# Patient Record
Sex: Female | Born: 1965 | Race: Black or African American | Hispanic: No
Health system: Western US, Academic
[De-identification: ages and names within clinical notes are randomized; demographics above are authoritative.]

## PROBLEM LIST (undated history)

## (undated) DIAGNOSIS — F419 Anxiety disorder, unspecified: Secondary | ICD-10-CM

## (undated) MED FILL — GLATIRAMER 40 MG/ML SUBCUTANEOUS SYRINGE: 40 mg/mL | SUBCUTANEOUS | 28 days supply | Qty: 12 | Fill #5

## (undated) MED FILL — ALPRAZOLAM 0.25 MG TABLET: 0.25 mg | ORAL | 30 days supply | Qty: 30 | Fill #0

## (undated) MED FILL — GLATIRAMER 40 MG/ML SUBCUTANEOUS SYRINGE: 40 mg/mL | SUBCUTANEOUS | Qty: 12 | Fill #0

## (undated) MED FILL — GLATIRAMER 40 MG/ML SUBCUTANEOUS SYRINGE: 40 mg/mL | SUBCUTANEOUS | 28 days supply | Qty: 12 | Fill #0

---

## 2017-01-17 NOTE — Telephone Encounter (Signed)
Patient called and LVM c/o pain. Patient is requesting an Rx to be sent to the pharmacy.    Returned patient's phone call and LVM requesting a call back.     Sent message to Velna Hatchet requesting to reach out to patient to schedule an appointment.

## 2017-03-23 NOTE — Telephone Encounter (Signed)
Patient called and left a message stating she has been experiencing an exacerbation since 01/25/16. Stated in the message that she "needs to talk to someone" as she is very sick still. She is requesting to speak with Kirkland Hun, NP. Noted patient has an appointment with Dr. Joeseph Amor on 03/27/17.    Returned patient's phone call to discuss symptoms. Patient answered and states she found some paperwork that reports she has problems with swallowing. Patient then states "I would like to schedule an appointment with patient relations". When asked to clarify who she would like to schedule an appointment with, patient then stated that she will need to call me back as she is in a meeting. She then hung up the phone.

## 2017-12-25 MED ORDER — GLATIRAMER 40 MG/ML SUBCUTANEOUS SYRINGE
40 | INJECTION | SUBCUTANEOUS | 0 refills | Status: DC
Start: 2017-12-25 — End: 2018-05-10

## 2017-12-25 NOTE — Telephone Encounter (Signed)
LPV to schedule follow-up with Dr. Joeseph Amor.

## 2017-12-25 NOTE — Telephone Encounter (Signed)
Received a refill request for pt's Copaxone 40 mg. Noted pt's last Copaxone was prescribed on 03/09/17 and pt was last seen by Dr. Joeseph Amor on 01/25/16. Called pt to f/u. Unable to get hold. LVM requesting to contact back.     No f/u appt scheduled noted. Will request follow up coordinator to assist with appointment and will pend refill request for Kirkland Hun, NP (covering for Dr. Joeseph Amor).

## 2017-12-25 NOTE — Telephone Encounter (Signed)
LVM for patient on both home and cell numbers requesting call to be return so scheduling of this patient ca be completed.

## 2017-12-28 NOTE — Telephone Encounter (Signed)
LVM to have pt call me back to schedule a fup appt with Dr. Joeseph Amor.

## 2017-12-28 NOTE — Telephone Encounter (Signed)
Patient called and LVM requesting for an appointment. Noted pt was last seen by MD on 01/25/2016. Will notify f/u coordinator to assist.

## 2018-02-27 ENCOUNTER — Ambulatory Visit: Admit: 2018-02-27 | Discharge: 2018-02-27 | Payer: MEDICARE | Attending: Neurology

## 2018-02-27 DIAGNOSIS — G35 Multiple sclerosis: Secondary | ICD-10-CM

## 2018-02-27 MED ORDER — PANTOPRAZOLE 40 MG TABLET,DELAYED RELEASE
40 | ORAL_TABLET | Freq: Every day | ORAL | 0 refills | Status: AC
Start: 2018-02-27 — End: ?

## 2018-02-27 MED ORDER — DIAPER,BRIEF,INFANT-TODD,DISP
Status: AC
Start: 2018-02-27 — End: ?

## 2018-02-27 MED ORDER — PREDNISONE 10 MG TABLET
10 | ORAL_TABLET | ORAL | 0 refills | Status: AC
Start: 2018-02-27 — End: ?

## 2018-02-27 NOTE — Progress Notes (Signed)
F/U MS     Meds:  Copaxone (generic) 40 mg tiw;        (feels that she is getting more SE (SOB, palpitations &, pain at IS with each injection)       and having more difficulty with MS)                          Advir 1 puff bid (asthma - COPD)                          Oxycontin 20 mg bid   (pain) - Pain management                          Norco (7.5/325) - 3-4 per day (pain) - Pain management                          CBD cream (helps with cramping)                          Vit D 4000 IU/d    10 Pt ROS () ;  Bladder:  +urgency; incont 2/wk    ;      --  got prescription for depends -- this is great as she doesn't have to pay out of pocket)      Bowel:   +const (better with Ensure)    Brain MRI: Not Done (last 2014)     Labs: Done yesterday -- pt to send     c/o: For the past 2-3 months her R hand has been shaking and has pain in forearm with writing     Numbness in fingertips as well    Not much change over interval     Last time she got steroids (prednisone orally) ;  but she didn't tolerate (had to be hospitalized for 3 days)    Tolerates a low dose     Otherwise no episodes     Uses walker or cane all the time; walks with a cane ~1 block (per pt) ; much less (per grand child)        Exam:    MS:  A+O x3  ;  Cog Fx:    nl   CrN II-XII: VA: 20/25 (-1)  OD ;  20/25 (-1)  OS    (with great effort)       () INO ;   () MG ; () Nyst    H / FS / FM / T / Palate / SCM : nl   Hearing (=) today    Motor:    Tone:     UE  nl ; LE:  nl     D T B WE FA IP Q ADD ABD H TA   R * *  * * *  * *  *   L 5+ 5+  5+ 5+ 5+  5+ 5+  5+     * [place] bilat but with occ full power on L; Never full power  on R but power varible.   Unable to lift R foot off floor but able to hold weight on R TA    Sens:  LT: Decreased in R UE (70%) and R LE (50%)    Cbl:  FTN, HKS,  RAM: Without ataxia  But with bilat tremor 6-8 Hz (sustention>intention>>rest).     In general tremor (R>>L) but varies; seems to affect legs as well while standing     L  hand and legs totally quiet at rest;     tone completely normal ; no other Sx of PD    Gait:  Very tentative and unsteady; requires assistance even for a few steps      DTRs  UE: 2+ (=)  LE: 3+ (=)       Assess: Possible MS Flare    Plan:  MRI Brain and C-spine    Rx with 80 mg prednisone and then taper;  protonix    Will switch back to brand name Copaxone    RV 6 months      I spent a total of  40 minutes with this patient, 30 of which was spent in  discussing with the patient the various treatment options, the disease course, and the patient's prognosis.

## 2018-05-14 MED ORDER — COPAXONE 40 MG/ML SUBCUTANEOUS SYRINGE
40 | SUBCUTANEOUS | 3 refills | Status: DC
Start: 2018-05-14 — End: 2019-04-01

## 2018-08-06 NOTE — Progress Notes (Signed)
[facility]  Pharmacist Medication Reconciliation Review       Spoke with Patient who agreed to a comprehensive medication review.    Updated Allergies     Patient allergies updated:  Yes    Medication Discrepancies and Points of Clarification    Medications ON the [facility] list that the patient is NOT taking:  baclofen    Medications NOT on the [facility] list that the patient IS taking:  None noted    Medications ON the [facility] list that the patient is taking DIFFERENTLY than ordered:  None noted    Drug-Drug interactions that were identified:  None noted     A/P  1. Patient needs md appt prior to getting refill for baclofen       Estimated total time spent: 0 to 5 minutes    [facility]  [phone]

## 2018-08-21 NOTE — Telephone Encounter (Signed)
Return patient's call requesting to schedule follow up appointment. Left message to callback.

## 2018-10-22 MED FILL — GLATIRAMER 40 MG/ML SUBCUTANEOUS SYRINGE: 40 mg/mL | SUBCUTANEOUS | 84 days supply | Qty: 36 | Fill #0

## 2018-11-30 NOTE — Telephone Encounter (Signed)
Called patient and assisted with follow up appointment

## 2018-12-10 NOTE — Telephone Encounter (Signed)
Called and left message to reschedule appointment

## 2019-01-14 MED FILL — GLATIRAMER 40 MG/ML SUBCUTANEOUS SYRINGE: 40 mg/mL | SUBCUTANEOUS | 84 days supply | Qty: 36 | Fill #1

## 2019-04-02 MED ORDER — COPAXONE 40 MG/ML SUBCUTANEOUS SYRINGE
40 | SUBCUTANEOUS | 3 refills | Status: DC
Start: 2019-04-02 — End: 2020-03-11
  Filled 2019-04-08: qty 36, 84d supply, fill #0

## 2019-04-02 NOTE — Telephone Encounter (Signed)
Called and assisted patient with follow up appointment.     Also, patient is requesting Zanax.    Routed to [place], to further assist.

## 2019-04-11 NOTE — Telephone Encounter (Signed)
Patient Navigator Support  Date: 8.27.29    Notes:  Tashis's VM:    - Cam LVM  - They called our clinic and LVM on 8/17 but has not heard back.   - Can patient use the generic medication of COPAXONE 40 mg/mL injection  - Please give them a call back.    - Phone #: [phone]

## 2019-04-29 NOTE — Telephone Encounter (Signed)
Per Dr.Goodin's recommendation, pended xanax 0.25mg  prn.

## 2019-04-30 MED ORDER — NALOXONE 4 MG/ACTUATION NASAL SPRAY
4 | Freq: Once | NASAL | 0 refills | Status: AC | PRN
Start: 2019-04-30 — End: ?

## 2019-04-30 MED ORDER — ALPRAZOLAM 0.25 MG TABLET
0.25 | ORAL_TABLET | Freq: Every day | ORAL | 1 refills | 30.00000 days | Status: AC | PRN
Start: 2019-04-30 — End: 2019-05-28

## 2019-05-28 MED ORDER — ALPRAZOLAM 0.25 MG TABLET
0.25 | ORAL | 0 refills | Status: AC | PRN
Start: 2019-05-28 — End: ?

## 2019-06-27 MED FILL — GLATIRAMER 40 MG/ML SUBCUTANEOUS SYRINGE: 40 mg/mL | SUBCUTANEOUS | 84 days supply | Qty: 36 | Fill #1 | Status: CP

## 2019-07-22 ENCOUNTER — Ambulatory Visit: Admit: 2019-07-22 | Discharge: 2019-07-27 | Payer: MEDICARE | Attending: Neurology | Primary: Physician

## 2019-07-22 DIAGNOSIS — G35 Multiple sclerosis (CMS code): Secondary

## 2019-07-22 NOTE — Telephone Encounter (Signed)
Physician/Bacline call: received a call from patient requesting assistance re: her recent visit to the emergency room. Per patient, she fell and hit her head and was in ER. Patient was crying and requesting assistance and advise from Dr. Blenda Peals. Informed patient that will reach out to the nurse and provided a contact information for future reference.     Routed to [place], Therapist, sports, to further assist.

## 2019-07-22 NOTE — Telephone Encounter (Signed)
MD aware and recommended to seek care at [facility] ER.

## 2019-07-22 NOTE — Progress Notes (Signed)
Telephone Call      Reason for the call: On Thursday patient fell down 7 flights of stairs, hit her head and had LOC for several minutes. Now, apparently, with multiple bruises, a black eye and marked difficulty with memory. See in local ER but sent home without scan.      Recommendation:  Go to the [facility] ER today to be evaluated

## 2019-07-23 ENCOUNTER — Emergency Department: Admit: 2019-07-23 | Discharge: 2019-07-23 | Payer: MEDICARE

## 2019-07-23 DIAGNOSIS — M25571 Pain in right ankle and joints of right foot: Secondary | ICD-10-CM

## 2019-07-23 DIAGNOSIS — S0990XA Unspecified injury of head, initial encounter: Secondary | ICD-10-CM

## 2019-07-23 DIAGNOSIS — G35 Multiple sclerosis: Secondary | ICD-10-CM

## 2019-07-23 DIAGNOSIS — M5030 Other cervical disc degeneration, unspecified cervical region: Secondary | ICD-10-CM

## 2019-07-23 DIAGNOSIS — M47812 Spondylosis without myelopathy or radiculopathy, cervical region: Secondary | ICD-10-CM

## 2019-07-23 DIAGNOSIS — R519 Headache, unspecified: Secondary | ICD-10-CM

## 2019-07-23 LAB — POCT COVID-19 RNA, QUALITATIVE: POCT COVID-19 RNA, Qualitative: NOT DETECTED

## 2019-07-23 LAB — ED INFORMATION EXCHANGE ORDER: EDIE Cures: 1

## 2019-07-23 MED ORDER — KETOROLAC 15 MG/ML INJECTION SOLUTION: 15 mg/mL | INTRAMUSCULAR | Status: DC

## 2019-07-23 MED ORDER — KETOROLAC 15 MG/ML INJECTION SOLUTION
15 mg/mL | INTRAMUSCULAR | Status: AC
  Administered 2019-07-23: 23:00:00 via INTRAMUSCULAR

## 2019-07-23 MED ORDER — ACETAMINOPHEN 500 MG TABLET
500 mg | ORAL | Status: AC
  Administered 2019-07-23: 23:00:00 via ORAL

## 2019-07-23 MED ORDER — ONDANSETRON 4 MG DISINTEGRATING TABLET
4 mg | ORAL | Status: DC | PRN
  Administered 2019-07-23: 23:00:00 via ORAL

## 2019-07-23 MED FILL — ACETAMINOPHEN 500 MG TABLET: 500 mg | ORAL | Qty: 2

## 2019-07-23 MED FILL — ONDANSETRON 4 MG DISINTEGRATING TABLET: 4 mg | ORAL | Qty: 1

## 2019-07-23 MED FILL — KETOROLAC 15 MG/ML INJECTION SOLUTION: 15 mg/mL | INTRAMUSCULAR | Qty: 1

## 2019-07-23 NOTE — ED Provider Notes (Signed)
ED Attendings     Zenda Alpers 07/23/19 14:26    History     Chief Complaint   Patient presents with    Fall     Pt with MS. Fell down seven steps on 07/18/19. Was seen at another ED then and DC'd without fx discovered or CT performed. Pt reports pain at various locations and her [place] [facility] Specialist told pt to report to ED for CT due to LOC at time of event and left sided head pain. Pt is A&Ox4.    Head Injury     reports nausea and one episode of vomiting today.       HPI  Jamerria Deturk 53 yo F hx of asthma, multiple sclerosis, GERD, chronic pain? Presents with fall with head trauma. Patient reportedly fell last Thursday as she was ambulating downstairs with her cane. Reports mechanical fall 2/2 improper cane placement. +LOC. Her neighbor saw the patient on the floor and subsequently called EMS. Patient seen at OSH on 12/3 with no imaging.     Today patient presents for R frontal and R posterior head pain. Reports episode of nausea/vomiting this morning, although has felt nauseous for the past few days. Reports difficulty looking at lights. Denies new sensory or motor deficits. +R ankle pain. Reports she has been forgetful and has been misplacing things for the past few days. Otherwise at baseline health. Patient well plugged into care. Patient called her Multiple Sclerosis clinic who told her to come in to the  ED for evaluation.     Allergies/Contraindications  No Known Allergies    Previous Medications    ALBUTEROL INH    Inhale into the lungs 3 (three) times daily.    ALPRAZOLAM (XANAX) 0.25 MG TABLET    Take 1 tablet (0.25 mg total) by mouth daily as needed for Anxiety    BACLOFEN (LIORESAL) 10 MG TABLET    Take 2 tablets (20 mg total) by mouth nightly at bedtime.    COPAXONE 40 MG/ML INJECTION    Inject 1 syringe (40mg ) subcutaneously three times a week.    DIAPER,BRIEF,INFANT-TODD,DISP (DIAPERS) MISC    by Misc.(Non-Drug; Combo Route) route.    DIAZEPAM (VALIUM) 5 MG TABLET    Take 1/2 tablet by mouth as  needed for anxiety and 1 tablet at bedtime as needed for insomnia while on steroid treatment.    FLUTICASONE/SALMETEROL (ADVAIR HFA INH)    Inhale into the lungs 2 (two) times daily.    HYDROCODONE-ACETAMINOPHEN (NORCO) 5-325 MG TABLET    Take 1 tablet by mouth every 6 (six) hours as needed for Pain.    IPRATROPIUM-ALBUTEROL (COMBIVENT RESPIMAT) 20-100 MCG/ACTUATION INHALER    Inhale 1 puff into the lungs 4 (four) times daily.    NALOXONE 4 MG/ACTUATION SPRAYNAERO    1 spray by Nasal route once as needed (suspected overdose) for up to 1 dose Call 911. Repeat if needed    OXYCODONE (OXYCONTIN) 20 MG 12 HR TABLET    Take 20 mg by mouth 2 (two) times daily.    PANTOPRAZOLE (PROTONIX) 40 MG EC TABLET    Take 1 tablet (40 mg total) by mouth Daily.    PREDNISONE (DELTASONE) 10 MG TABLET    Take 4 tabs (80 mg) po q day for 7 days ; then taper by 10 mg/d until off              Social History     Socioeconomic History    Marital status: Significant  Other     Spouse name: Not on file    Number of children: Not on file    Years of education: Not on file    Highest education level: Not on file   Tobacco Use    Smoking status: Smoker, Current Status Unknown     Types: Cigarettes    Tobacco comment: 4 times a day   Substance and Sexual Activity    Alcohol use: No    Drug use: No       Review of Systems     Review of Systems   Constitutional: Negative for chills and fever.   HENT: Negative for congestion and voice change.    Eyes: Negative for visual disturbance.   Respiratory: Negative for cough and shortness of breath.    Cardiovascular: Negative for chest pain and palpitations.   Gastrointestinal: Positive for nausea and vomiting. Negative for abdominal pain.   Genitourinary: Negative for difficulty urinating.   Musculoskeletal:        +fall. +R chest wall pain, R ankle pain. R head pain   Skin: Negative for pallor.   Neurological: Positive for headaches. Negative for syncope.   Psychiatric/Behavioral: Positive for  confusion. Negative for suicidal ideas.       Physical Exam   Triage Vital Signs:  BP: 136/70, Heart Rate: 84, Pulse - Palpated/Pleth: 84, Temp: 36.1 C (97 F), *Resp: 16, SpO2: 97 %    Physical Exam  Constitutional:       General: She is not in acute distress.     Appearance: Normal appearance. She is well-developed.      Comments: +tearful   HENT:      Head: Normocephalic and atraumatic.      Comments: +R frontal bone ttp, no hematoma or bruising. +R Posterior occiput ttp, no hematoma or bruising     Nose: No congestion.      Mouth/Throat:      Mouth: Mucous membranes are moist.   Eyes:      Extraocular Movements: Extraocular movements intact.      Conjunctiva/sclera: Conjunctivae normal.      Pupils: Pupils are equal, round, and reactive to light.   Neck:      Musculoskeletal: Normal range of motion.      Trachea: No tracheal deviation.   Cardiovascular:      Rate and Rhythm: Normal rate and regular rhythm.      Pulses: Normal pulses.      Comments: +R posterior chest wall ttp, no signs of erythema, crepitus or contusion.  Pulmonary:      Effort: No respiratory distress.      Breath sounds: Normal breath sounds.   Abdominal:      General: There is no distension.      Tenderness: There is no abdominal tenderness.   Musculoskeletal:         General: Tenderness present. No swelling or deformity.      Right lower leg: No edema.      Left lower leg: No edema.      Comments: +TTP over R ankle, anterior to R malelous   Skin:     General: Skin is warm.      Capillary Refill: Capillary refill takes less than 2 seconds.   Neurological:      Mental Status: She is alert and oriented to person, place, and time.      Cranial Nerves: No cranial nerve deficit.      Sensory: No sensory deficit.  Motor: No weakness.      Comments: Ambulatory with cane   Psychiatric:         Attention and Perception: Attention normal.         Mood and Affect: Affect is labile and tearful.         Speech: Speech is not tangential.          Thought Content: Thought content does not include homicidal or suicidal ideation.          Initial Assessment (problem list and differential diagnosis)     Rashon Rezek is  53 yo F hx of asthma, multiple sclerosis, GERD, chronic pain? Presents with fall with head trauma x 7 days ago. VS unremarkable. Exam, primarily MSK and neuro exam as stated above. Presentation most c/f concussion s/p fall with head trauma. Will rule out intracranial pathology including ICH v fracture with CT imaging. Patient endorsing R chest wall ttp, no objective signs of trauma or fracture will assess ribs with imaging study considering frx v contusion v pulm contusion, low concern for vascular catastrophe. Will assess R ankle for fracture but low concern given patient has been ambulatory with cane at baseline, low c/f neurovascular injury v compartment syndrome.    Plan:  - CT head/neck  - CXR, R Ankle Xray  - pain control    Interpretations:  Lab, Imaging, and ECG        XR Chest 1 View (AP Portable)   Final Result   FINDINGS/IMPRESSION:      Normal cardiac and mediastinal silhouettes.  Lungs appear clear.      Report dictated by: Aretta Nip, MD, signed by: Aretta Nip, MD   Department of Radiology and Biomedical Imaging      CT Brain without Contrast   Preliminary Result      1.  No acute intracranial abnormality.  Scattered supratentorial white matter hypodensities consistent with patient's history of MS      2.  No acute fracture or traumatic malalignment of the cervical spine.      3.  Multilevel moderate bony degenerative changes in the cervical spine with findings as described above            CT Cervical Spine without Contrast   Preliminary Result      1.  No acute intracranial abnormality.  Scattered supratentorial white matter hypodensities consistent with patient's history of MS      2.  No acute fracture or traumatic malalignment of the cervical spine.      3.  Multilevel moderate bony degenerative changes  in the cervical spine with findings as described above            XR Ankle 3 Views, Right    (Results Pending)         Reassessment and Final Disposition     ED Course as of Jul 22 1506   Jeziah Kretschmer's Documentation   Tue Jul 23, 2019   1410 CT head and C spine reassuring per conversation with neuro-radiologist. Pending CXR and Ankle Xray.      1420 CT Brain/C spine:  "IMPRESSION:     1.  No acute intracranial abnormality.  Scattered supratentorial white matter hypodensities consistent with patient's history of MS    2.  No acute fracture or traumatic malalignment of the cervical spine.    3.  Multilevel moderate bony degenerative changes in the cervical spine with findings as described above"      1445 CXR:  "  FINDINGS/IMPRESSION:    Normal cardiac and mediastinal silhouettes.  Lungs appear clear."      1450 Ankle w/o acute fracture.      1501 Patient reports she feels better. Will discharge home with strict return precautions.            Clarene Reamer, MD  Resident  07/23/19 1507       Tharon Aquas, MD  07/23/19 [phone]

## 2019-08-01 NOTE — Telephone Encounter (Signed)
At 12:29 PM, Pt called and LVM requesting for a call back. Called pt to follow up.     Pt reported she is f/u with her PCP d/t recent fall. Per pt, she has brain MRI done and no acute abnormal noted. Pt requested for her MRI #. Provided per pt's request.     Pt also requested for a f/u appointment. Will request Freda Munro [facility] to assist. Pt aware and requested to contact her at [phone].

## 2019-08-07 NOTE — Telephone Encounter (Signed)
On 08/06/2019 at 3:59 PM, pt called and LVM requesting for a call back. Reported experiencing symptoms. Noted pt's speech was slurred. Called pt to f/u.    Pt reported her speech is slurred and is having difficulty articulating words. Also c/o cloudy vision. Per pt,s she always had cloudy vision but noted worsening on her left eye currently. When questioned, Pt also c/o being forgetful. Noted pt recently had a fall and was seen at [facility] ER with head trauma.     When questioned, pt denied SOB, chest pain, fever, or any other acute symptoms. Pt reported she her Home Health Nurse came yesterday for a visit and recommended low stimulation surrounding to help with symptoms pt is experiencing.     Instructed pt to seek ER care since pt's speech was severely slurred. Pt reported ER would not treat for her current symptoms and reported she feels like it is a MS flare up. Requested to notify Dr. Blenda Peals and to provide steroids. Notified pt that I will follow up with Dr. Blenda Peals but would recommended to seek ER care if symptoms worsens. Pt verbalized awareness.    When questioned, pt does have help at home if needed.

## 2019-08-13 ENCOUNTER — Ambulatory Visit: Admit: 2019-08-13 | Payer: MEDICARE | Attending: Neurology | Primary: Physician

## 2019-08-13 DIAGNOSIS — G35 Multiple sclerosis (CMS code): Secondary

## 2019-08-13 MED ORDER — DIAZEPAM 5 MG TABLET
5 | ORAL_TABLET | ORAL | 0 refills | 4.00000 days | Status: AC
Start: 2019-08-13 — End: ?

## 2019-08-13 MED ORDER — PANTOPRAZOLE 40 MG TABLET,DELAYED RELEASE
40 | ORAL_TABLET | Freq: Every day | ORAL | 0 refills | Status: AC
Start: 2019-08-13 — End: ?

## 2019-08-13 MED ORDER — DEXAMETHASONE 4 MG TABLET
4 | ORAL_TABLET | ORAL | 0 refills | 6.00000 days | Status: DC
Start: 2019-08-13 — End: 2020-02-14

## 2019-08-13 MED ORDER — DEXAMETHASONE 4 MG TABLET
4 | ORAL_TABLET | Freq: Every day | ORAL | 0 refills | Status: AC
Start: 2019-08-13 — End: 2019-08-16

## 2019-08-13 NOTE — Progress Notes (Addendum)
F/U MS     Meds:    Copaxone (generic) 40 mg tiw;                                      (feels that she is getting more SE (SOB, palpitations &, pain at IS with each injection)                                                   and having more difficulty with MS)  Advir 1 puff bid (asthma - COPD)  Oxycontin 20 mg bid(pain) - Pain management  Norco (7.5/325) - 3-4 per day (pain) - Pain management  CBD cream (helps with cramping)  Vit D 4000 IU/d    10 Pt ROS () ;           Bladder:  +urgency; incont 2/wk  ;                                            --  got prescription for depends -- this is great as she doesn't have to pay out of pocket)                                        Bowel:   +const (better with Ensure)    CT Brain 07/23/19: CT head: No acute intracranial abnormality.  Scattered supratentorial white matter hypodensities consistent with patient's history of MS.     CT C-spine 07/23/19:   1. No acute intracranial abnormality.  Scattered white matter hypodensities consistent with multiple sclerosis.  2. No acute fracture or traumatic malalignment of the cervical spine. Multilevel moderate degenerative changes in the cervical spine.    Labs: Not done     c/o:   On Thursday 07/18/19 patient fell down 7 flights of stairs, hit her head and had LOC for several minutes. Now, apparently, with multiple bruises, a black eye and marked difficulty with memory. See in local ER but sent home without scan.    Seen in [place] ED 07/23/19  For: "Today patient presents for R frontal and R posterior head pain. Reports episode of nausea/vomiting this morning, although has felt nauseous for the past few days. Reports difficulty looking at lights. Denies new sensory or motor deficits + R ankle pain. Reports she has been forgetful and has been misplacing things for the past few days."       CT head: No acute  intracranial abnormality.  Scattered supratentorial white matter hypodensities consistent with patient's history of MS.    Since fall speech has become increasingly difficulty ; maybe a little better in past 1-2 days. She has had these Sx before. She feels that this is an MS sx. Previously she responded to IVSM.           Exam:   Speech is very halting and nonfluent. Not aphasic and no difficulty with comprehension.     Repeats easily and includes all connectors for "its a beautiful day in [place]"    Has considerable difficulty with "no ifs and or buts"  Assess: ? Episode of MS     Plan:   Pt very much wants to try course of oral steroids (as before)    [place] -- we will try a course of steroids + ambien + protonix    Will touch base next week       I spent a total of 25 minutes in face -to-face time with the patient and in non-face-to-face activities conducted today 08/13/19 directly related to this telephone visit, including reviewing records and tests, obtaining history and exam, placing orders, communicating with other healthcare professionals, counseling the patient, family or caregiver, documenting in the medical record, and/or care coordination for the diagnoses above.

## 2019-08-14 MED ORDER — ALPRAZOLAM 0.25 MG TABLET
0.25 | ORAL_TABLET | Freq: Every day | ORAL | 3 refills | Status: DC | PRN
Start: 2019-08-14 — End: 2020-08-28

## 2019-08-14 NOTE — Telephone Encounter (Signed)
Called and spoke with Wendy Pena, pharmacist and gave verbal order per Dr. Octaviano Batty order as below;    diazePAM (VALIUM) 5 mg tablet 30 tablet 0 08/13/2019     Sig: Take1 tablet at bedtime as needed for insomnia while on steroid treatment.        Pharmacist read back the order.

## 2019-09-17 MED FILL — GLATIRAMER 40 MG/ML SUBCUTANEOUS SYRINGE: 40 mg/mL | SUBCUTANEOUS | 28 days supply | Qty: 12 | Fill #2 | Status: CP

## 2019-09-19 ENCOUNTER — Ambulatory Visit: Admit: 2019-09-19 | Payer: Commercial Managed Care - HMO | Attending: Neurology | Primary: Physician

## 2019-09-19 DIAGNOSIS — G35 Multiple sclerosis (CMS code): Secondary

## 2019-09-19 DIAGNOSIS — G44311 Acute post-traumatic headache, intractable: Secondary | ICD-10-CM

## 2019-09-19 NOTE — Progress Notes (Signed)
F/U MS     Meds:  Copaxone(generic)40 mg tiw;  (feels that she is getting more SE (SOB, palpitations &, pain at IS with each injection)   and having more difficulty with MS)  Advir 1 puff bid (asthma - COPD)  Oxycontin 20 mg bid(pain) - Pain management  Norco (7.5/325) - 3-4 per day (pain) - Pain management  CBD cream (helps with cramping)  Vit D4000 IU/d    10 Pt ROS () ;  Bladder:   OK         Bowel:    OK    Brain MRI: Not Done (last July 2019)    Labs: Not done     c/o: Speech is still very halting and nonfluent.    Not aphasic and no difficulty with comprehension.      Now c/o HA holocephalic; throbbing; +photophobia; +phonophobia    +visual  Sx "boxes"     Maybe a little better or No ?                               Exam:  Has difficulty with:    "no ifs ands or buts"  =   "no ifs ands _ buts"     "its [place]"  =  "its a beautiful day [place]      Assess: Unclear why her speech is still being affected      Plan:   Repeat MRI  -- Compare with previous    RV after scan         I spent a total of 20 minutes in face -to-face time with the patient and in non-face-to-face activities conducted today 09/20/19 directly related to this video visit, including reviewing records and tests, obtaining history and exam, placing orders, communicating with other healthcare professionals, counseling the patient, family or caregiver, documenting in the medical record, and/or care coordination for the diagnoses above.

## 2019-10-01 ENCOUNTER — Ambulatory Visit: Admit: 2020-02-18 | Payer: Commercial Managed Care - HMO | Attending: Neurology | Primary: Physician

## 2019-10-16 MED FILL — GLATIRAMER 40 MG/ML SUBCUTANEOUS SYRINGE: 40 mg/mL | SUBCUTANEOUS | 28 days supply | Qty: 12 | Fill #3 | Status: CP

## 2019-11-06 NOTE — Telephone Encounter (Signed)
Rx: Copaxone 40mg /ml injection  PA: Submitted  CMM Key:  Insurance info: [employer] Medicare D    Notes:    [facility]  Fax: [phone]  Pharmacy:[phone]

## 2019-11-06 NOTE — Telephone Encounter (Signed)
PRIOR AUTHORIZATION STATUS UPDATE    Rx: Copaxone 40mg /ml    PA Status: Approved  PA Case #:  Start Date of PA: 08/16/19  End Date of PA: 11/05/20  # of Refills Authorized: until 11/05/20    Pharmacy: [facility]  Pharmacy Phone Number: [phone]  Please send refills to [facility] for insurance benefits investigation.    Insurance info: An[employer] Medicare  Member ID#: [id number]  Copay: $0 Copay    Note: [facility] will notify patient and coordinate medication delivery.      [id number]  [facility]  Fax: [phone]  Phone: [phone]

## 2019-11-11 MED FILL — GLATIRAMER 40 MG/ML SUBCUTANEOUS SYRINGE: 40 mg/mL | SUBCUTANEOUS | 28 days supply | Qty: 12 | Fill #4 | Status: CP

## 2019-12-26 MED FILL — GLATIRAMER 40 MG/ML SUBCUTANEOUS SYRINGE: 40 mg/mL | SUBCUTANEOUS | 28 days supply | Qty: 12 | Fill #5 | Status: CP

## 2020-01-22 MED FILL — GLATIRAMER 40 MG/ML SUBCUTANEOUS SYRINGE: 40 mg/mL | SUBCUTANEOUS | 28 days supply | Qty: 12 | Fill #6 | Status: CP

## 2020-02-12 NOTE — Telephone Encounter (Signed)
Faxed imaging request to [facility] at [phone].    --------------------------------------  Re: Wendy Pena  DOB: [birth year]    -IMAGING REQUEST-    Patient was referred to our clinic to establish care. Please send all available MR/CT imaging (i.e. brain, spine, orbits) in a DIACOM-compatible disc to the following address:    [facility]  ATTN: Dr. Jacqulynn Cadet  [address]  [place], [place] [address]    OR    Push images electronically through PACS to "[facility]" and please notify us either by call/fax when done so we can process.    Please fax reports to [phone], attention to Dr. Jacqulynn Cadet.    Thank you.

## 2020-02-14 MED ORDER — LIDOCAINE (PF) 10 MG/ML (1 %) NASAL SPRAY
10 | NASAL | Status: AC
Start: 2020-02-14 — End: ?

## 2020-02-14 MED ORDER — DICLOFENAC 1 % TOPICAL GEL
1 | TOPICAL | Status: AC
Start: 2020-02-14 — End: ?

## 2020-02-14 NOTE — Progress Notes (Signed)
[facility]  Pharmacist Medication Management      Spoke with Patient who agreed to a comprehensive medication review.    Patient allergies reviewed:  Yes  a. Any onset of new allergies? None noted    Medication recall process reviewed with patient and verbalized understanding?  Yes    Does the patient have an emergency contact person?  Yes: Dorette Grate [phone]; removed Deandre Chatman    Does the patient have any dietary restrictions? i.e. vegan, gluten-free, etc.  None noted     Does the patient have any functional limitations? i.e. hearing impaired, mobility constraints, etc.  Yes, patient new functional limitations include worsened memory and increased pain on left side. Patient has MRI and follow-up scheduled with provider.    Does the patient have any concerns in affording their medication?  Patient has $0 copay    Does patient have adequate living arrangements to adhere to therapy? i.e. homeless, no refrigerator, etc.  Yes. Patient's apartment has an upcoming remodel scheduled for 03/30/2020. Patient is requesting to stay on site during remodel.    Is patient educated on emergency preparedness? Information is in our [employer] and pharmacy website under resources  Yes    For re-assessments only: Has patient missed any doses in the last 3 months?  No missed doses    Patient's Goal of Therapy:  Patient agrees to goals of therapy: stay stable on medication with no side effects      [facility]  Pharmacist Medication Reconciliation    Medication Discrepancies and Points of Clarification    Medications ON the [facility] list that the patient is NOT taking:  Alprazolam 0.25mg ; Diazepam 5mg ; Dexamethasone 4mg ; Prednisone 10mg      Medications NOT on the [facility] list that the patient IS taking:  Lidocaine nasal spray; Diclofenac 1% gel    Medications ON the [facility] list that the patient is taking DIFFERENTLY than ordered:  None noted    Drug-Drug interactions that were identified:  None  noted      A/P  1. Continue Copaxone 40mg  three times a week. No reported side effects or missed doses. 6 month reassessment       Estimated total time spent: 10 to 15 minutes    , PharmD  [facility]  [phone]

## 2020-02-18 MED FILL — GLATIRAMER 40 MG/ML SUBCUTANEOUS SYRINGE: 40 mg/mL | SUBCUTANEOUS | 28 days supply | Qty: 12 | Fill #7 | Status: CP

## 2020-03-11 MED ORDER — COPAXONE 40 MG/ML SUBCUTANEOUS SYRINGE
40 | SUBCUTANEOUS | 3 refills | 29.00000 days | Status: DC
Start: 2020-03-11 — End: 2020-09-24
  Filled 2020-03-18: qty 12, 28d supply, fill #0

## 2020-04-02 ENCOUNTER — Other Ambulatory Visit: Payer: Self-pay | Admitting: Physician Assistant

## 2020-04-02 DIAGNOSIS — N644 Mastodynia: Secondary | ICD-10-CM

## 2020-04-09 MED FILL — GLATIRAMER 40 MG/ML SUBCUTANEOUS SYRINGE: 40 mg/mL | SUBCUTANEOUS | 28 days supply | Qty: 12 | Fill #1 | Status: CP

## 2020-05-07 MED FILL — GLATIRAMER 40 MG/ML SUBCUTANEOUS SYRINGE: 40 mg/mL | SUBCUTANEOUS | 28 days supply | Qty: 12 | Fill #2 | Status: CP

## 2020-05-08 ENCOUNTER — Ambulatory Visit
Admission: RE | Admit: 2020-05-08 | Discharge: 2020-05-08 | Disposition: A | Discharge status: Home / Self Care | Payer: No Typology Code available for payment source | Source: Ambulatory Visit | Attending: Physician Assistant | Admitting: Physician Assistant

## 2020-05-08 ENCOUNTER — Ambulatory Visit: Payer: Self-pay

## 2020-05-08 ENCOUNTER — Other Ambulatory Visit: Payer: Self-pay

## 2020-05-08 DIAGNOSIS — N644 Mastodynia: Secondary | ICD-10-CM

## 2020-06-04 MED FILL — GLATIRAMER 40 MG/ML SUBCUTANEOUS SYRINGE: 40 mg/mL | SUBCUTANEOUS | 28 days supply | Qty: 12 | Fill #3 | Status: CP

## 2020-06-29 MED FILL — GLATIRAMER 40 MG/ML SUBCUTANEOUS SYRINGE: 40 mg/mL | SUBCUTANEOUS | 28 days supply | Qty: 12 | Fill #4 | Status: CP

## 2020-07-24 MED ORDER — COPAXONE 40 MG/ML SUBCUTANEOUS SYRINGE: 40 mg/mL | SUBCUTANEOUS | 0 refills | Status: DC

## 2020-08-18 NOTE — Telephone Encounter (Signed)
Specialty Pharmacy  Prior Authorization Note    Medication prior authorization submitted for Copaxone 40MG /ML syringes    Submitted on 08/19/2019 via CMM Key: [id number]/17/[id number]  Insurance - [employer]    [id number]

## 2020-08-28 MED ORDER — COPAXONE 40 MG/ML SUBCUTANEOUS SYRINGE
40 | SUBCUTANEOUS | 3 refills | Status: AC
Start: 2020-08-28 — End: ?
  Filled 2020-09-04: qty 12, 28d supply, fill #5

## 2020-08-28 MED ORDER — ALPRAZOLAM 0.25 MG TABLET
0.25 | ORAL_TABLET | Freq: Every day | ORAL | 3 refills | 30.00000 days | Status: DC | PRN
Start: 2020-08-28 — End: 2021-05-04

## 2020-09-08 MED FILL — GLATIRAMER 40 MG/ML SUBCUTANEOUS SYRINGE: 40 mg/mL | SUBCUTANEOUS | 28 days supply | Qty: 12 | Fill #5 | Status: CN

## 2020-09-21 MED FILL — GLATIRAMER 40 MG/ML SUBCUTANEOUS SYRINGE: 40 mg/mL | SUBCUTANEOUS | 28 days supply | Qty: 12 | Fill #5 | Status: CN

## 2020-10-06 NOTE — Progress Notes (Signed)
ADHERENCE    Pharmacy was unable to contact patient after multiple attempts regarding refill of glatiramer (Copaxone) 40mg . Both phone numbers were inactive with no voicemail. Prescription was saved onto patient's medication profile for future fills.     Please have patient contact [facility] [phone] for refills.    Clinic notified.    Left voicemail 09/28/20  Left voicemail 09/30/20  No voicemail 10/02/20  No voicemail, No Mychart, clinic notified 10/06/20    10/08/20  [facility]  Phone: [phone]

## 2020-10-06 NOTE — Telephone Encounter (Signed)
Hi team,     Please assist.     Thank you,   Ale

## 2020-10-06 NOTE — Telephone Encounter (Signed)
Hi Tashi,     Called all the phone numbers in pt's chart, I was only able to leave a message requesting a call back from pt to the number ending in [phone].     Thank you,   Ale

## 2020-10-06 NOTE — Telephone Encounter (Signed)
Attempted to reach pt via [phone] since the other two phone number are out of service. Unable to get hold. MD has been notified.

## 2020-10-06 NOTE — Telephone Encounter (Signed)
Attempted to reach pt on all the three contact number listed on pt's file. Unable to get hold. Will request [facility] Team to assist to get hold of pt and will notify Goodin.

## 2020-10-08 ENCOUNTER — Ambulatory Visit: Admit: 2020-10-08 | Payer: Commercial Managed Care - HMO | Attending: Neurology | Primary: Physician

## 2020-10-08 DIAGNOSIS — G35 Multiple sclerosis (CMS code): Secondary

## 2020-10-08 NOTE — Progress Notes (Signed)
F/U MS     Meds:  Copaxone(generic)40 mg tiw;  (feels that she is getting more SE (SOB, palpitations &, pain at IS with each injection)   and having more difficulty with MS)  Advir 1 puff bid (asthma - COPD)  Oxycontin 20 mg bid(pain) - Pain management  Norco (7.5/325) - 3-4 per day (pain) - Pain management  CBD cream (helps with cramping)  Vit D4000 IU/d    10 Pt ROS (-) ;  Bladder:   OK         Bowel:    OK    Brain MRI: 10/01/19 -- +WM c/w MS -- (no GD given; no comparison)    Per my view, I agree but I can't load the 2014 scan to compare          Labs: Scheduled to have -- pt to send     c/o: Not doing well     Larey Seat down stairs in December 2021 -- +LOC x 5-10 min    -- migraines now every day ; worse after fall    -- speech worse    -- dropping things      Previously c/o daily HA holocephalic; throbbing; +photophobia; +phonophobia                          +visual  Sx "boxes"          No episodes        Exam:  Speech is still very halting and nonfluent.       Not aphasic and no difficulty with comprehension.       "no ifs ands or buts" =   "no ifs _ buts"     "its a beautiful day in [place]"  = "its a beautiful day [place]      Assess: Unclear exactly what is different    However, concerning that condition worse after significant fall with LOC      Plan:  Repeat MRI -- compare with previous    RV 6 months   Speak by phone after Scan              I spent a total of 30 minutes in face -to-face time with the patient and in non-face-to-face activities conducted today 10/08/20 directly related to this video visit, including reviewing records and tests, obtaining history and exam, placing orders, communicating with other healthcare professionals, counseling the patient, family or caregiver, documenting in the medical  record, and/or care coordination for the diagnoses above.

## 2020-10-08 NOTE — Telephone Encounter (Signed)
Called [phone] (pt's cell) and was able to get a ring tone but was not able to speak with pt. Left detail voice message and requested to contact [facility] at [phone] to obtain her Copaxone prescription.     Plus, requested to reach our clinic if further assistant is needed.

## 2020-11-05 IMAGING — MG DIGITAL DIAGNOSTIC BILAT W/ TOMO W/ CAD
8 series · 8 of 24 positions shown · non-contrast
Comparison: Previous exam(s).

CLINICAL DATA: 54-year-old female with diffuse UPPER breast pain.
Baseline mammogram.

EXAM:
DIGITAL DIAGNOSTIC BILATERAL MAMMOGRAM WITH CAD AND TOMO

[L CC synth-2D]
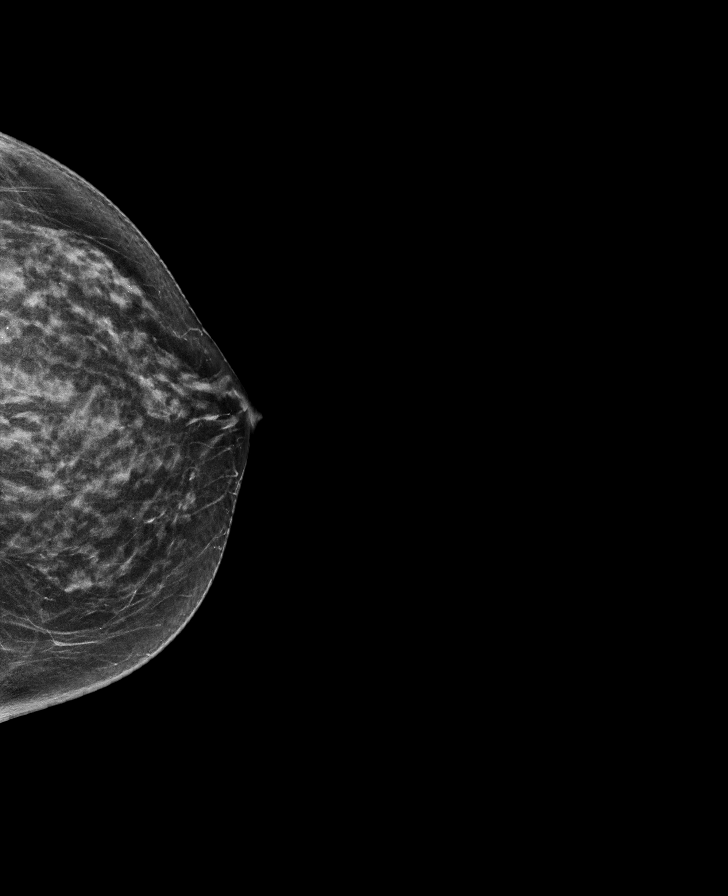

[R CC synth-2D]
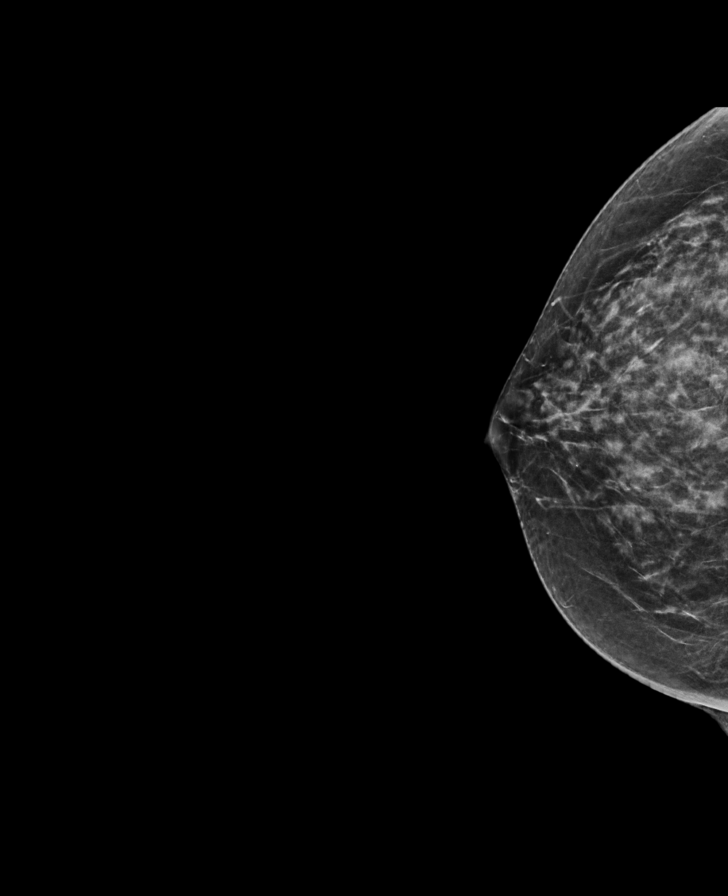

[L MLO synth-2D]
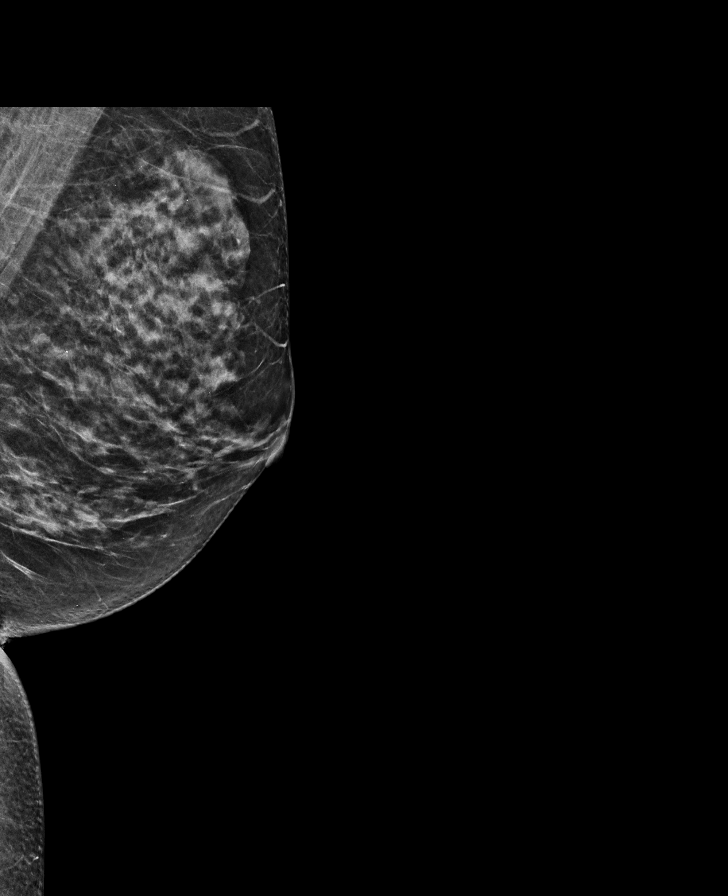

[R MLO synth-2D]
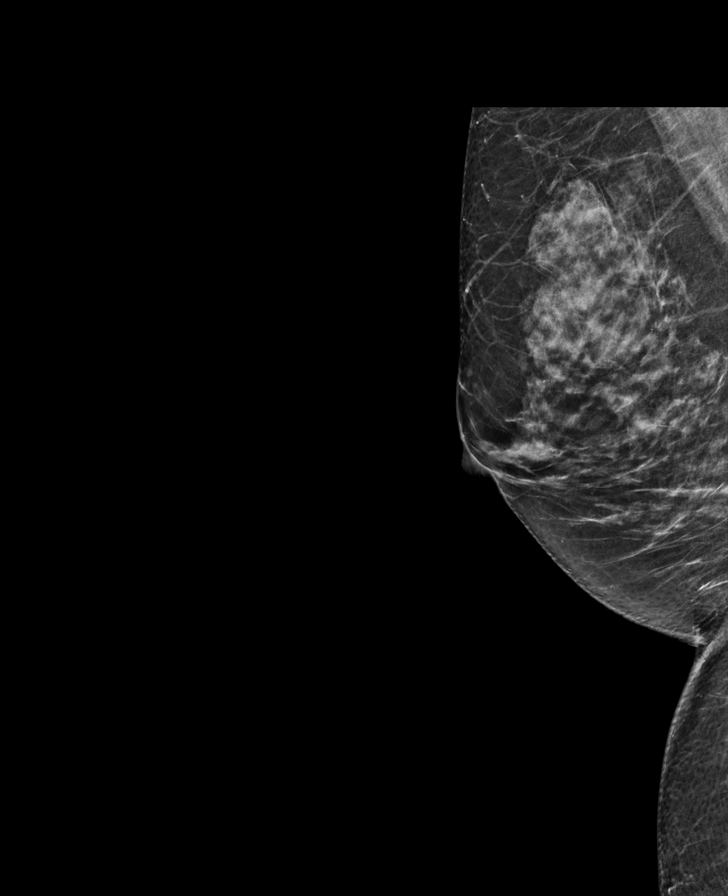

[L MLO tomo · tomo slice 29/58.0]
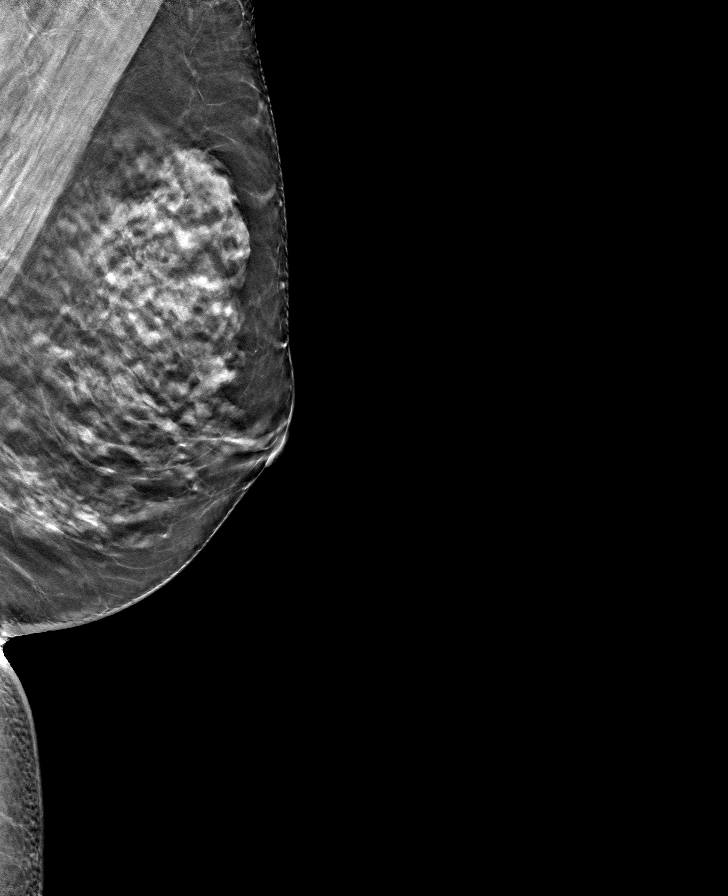

[R CC tomo · tomo slice 29/58.0]
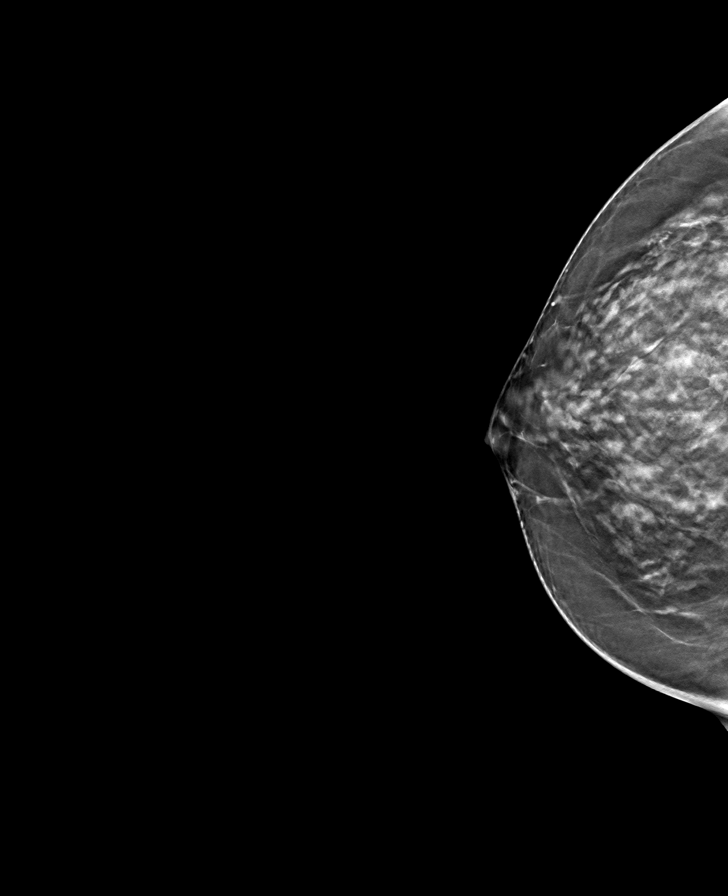

[L CC tomo · tomo slice 29/58.0]
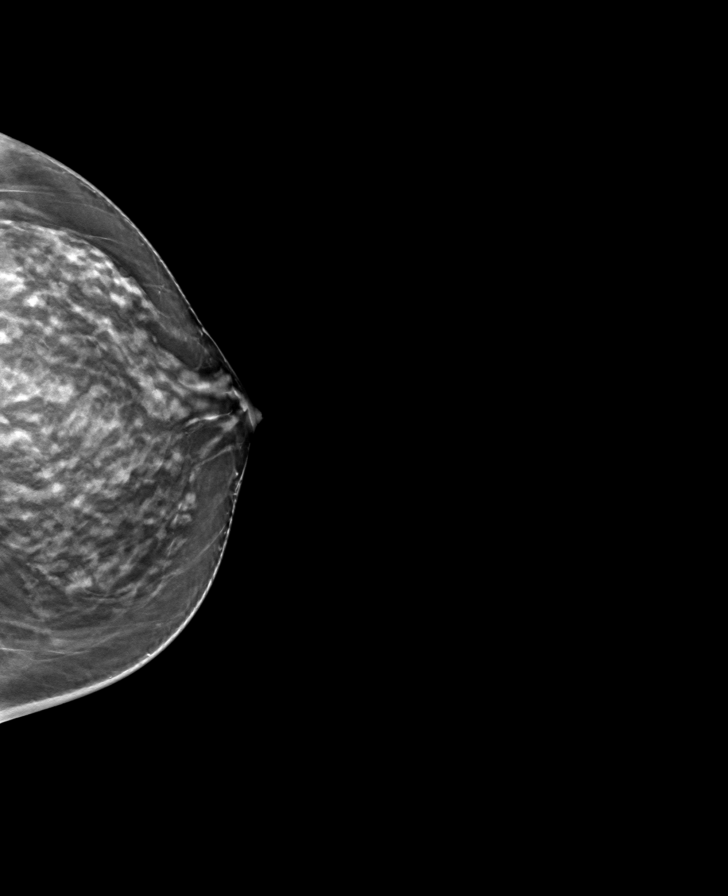

[R MLO tomo · tomo slice 29/58.0]
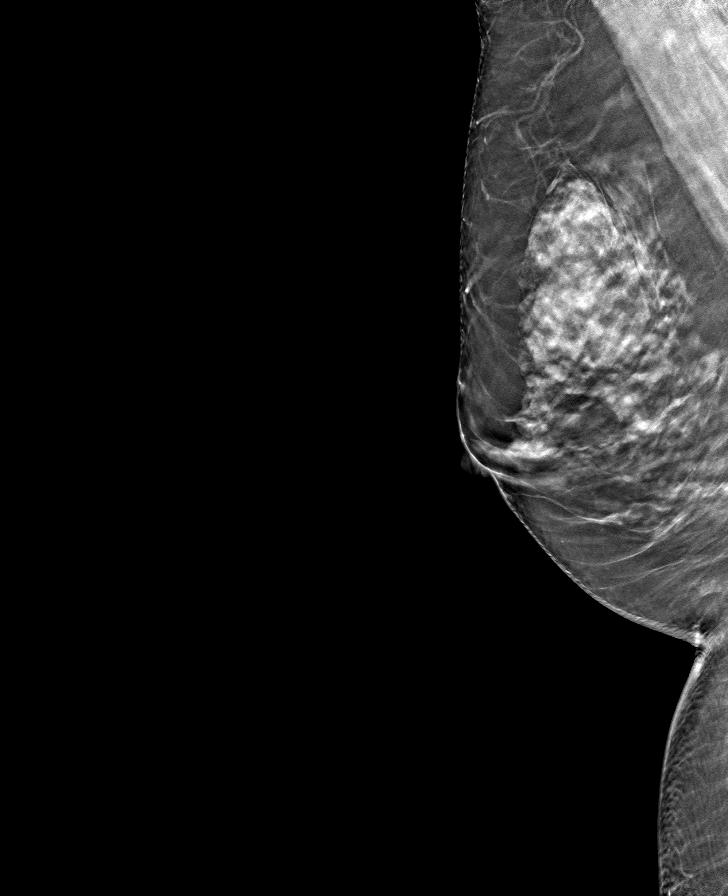

[8 of 24 positions shown; findings below may reference images not displayed]

ACR Breast Density Category c: The breast tissue is heterogeneously
dense, which may obscure small masses.
FINDINGS: 2D/3D full field views of both breasts demonstrate no suspicious
mass, distortion or worrisome calcifications.

Mammographic images were processed with CAD.
IMPRESSION: No mammographic evidence of breast malignancy.

RECOMMENDATION:
Bilateral screening mammogram in 1 year.

I have discussed the findings, causes and remedies of breast pain
and recommendations with the patient. If applicable, a reminder
letter will be sent to the patient regarding the next appointment.

BI-RADS CATEGORY  1: Negative.

## 2021-05-04 NOTE — Telephone Encounter (Signed)
Received call on nursing line from patient      Requesting:  -Xanax refill to [facility] on file  -to schedule a follow up     Routed to [facility] team and Michaelle Birks., LVN, for further assistance.

## 2021-05-04 NOTE — Telephone Encounter (Signed)
Pended new prescription of xanax for Dr.Goodin to sign to go to the [facility] per patient request.

## 2021-05-14 MED ORDER — ALPRAZOLAM 0.25 MG TABLET
0.25 | ORAL_TABLET | Freq: Every day | ORAL | 3 refills | 30.00000 days | Status: AC | PRN
Start: 2021-05-14 — End: 2021-12-06

## 2021-10-15 NOTE — Telephone Encounter (Signed)
Received VM from caregiver    Requesting to schedule a follow up    Routed to [facility] team for further assistance.

## 2021-12-06 NOTE — Telephone Encounter (Signed)
Patient was last seen on 10/08/2020 with recommendation for a 6 month follow up. Noted 2 previous follow up appointment patient was a no show. Will forward to [facility]cc team for further assistance.

## 2021-12-08 MED ORDER — ALPRAZOLAM 0.25 MG TABLET
0.25 | ORAL_TABLET | ORAL | 0 refills | 30.00000 days | Status: DC
Start: 2021-12-08 — End: 2022-12-26

## 2022-03-07 NOTE — Telephone Encounter (Signed)
Patient was last seen on 10/08/2020 and now overdue for a follow up.  Attempted to call patient several times to schedule a follow up appointment, however, unable to get a hold of.  Patient did not pick up any of my calls and unable to leave a voice message as her voice mailbox is full.

## 2022-12-21 NOTE — Telephone Encounter (Signed)
Spoke with patient and agreed sx warrant a sooner visit, video ok. Transferred to [facility] team to schedule.

## 2022-12-21 NOTE — Telephone Encounter (Signed)
Patient was warm transferred from contact center, requested an appt to see Dr. Joeseph Amor, was offered the first in-office appt in October however pt states that is too far out for her new symptom that occurred 2 weeks ago: left eye is progressively getting worse, blurry vision and eyeball is moving by itself causing her to loose balance..     Warm transferred to [place].

## 2022-12-26 ENCOUNTER — Telehealth: Admit: 2022-12-26 | Payer: Commercial Managed Care - HMO | Attending: Neurology | Primary: Physician

## 2022-12-26 DIAGNOSIS — G35 Multiple sclerosis: Secondary | ICD-10-CM

## 2022-12-26 MED ORDER — ALPRAZOLAM 0.25 MG TABLET
0.25 | ORAL_TABLET | ORAL | 0 refills | 30.00000 days | Status: DC
Start: 2022-12-26 — End: 2022-12-26

## 2022-12-26 NOTE — Telephone Encounter (Signed)
Prior authorization not needed for Alprazolam 0.25mg  tablets      Wendy Pena  [facility]  [phone]

## 2022-12-26 NOTE — Progress Notes (Signed)
F/U MS     Meds:   Copaxone (generic) 40 mg tiw;                                         -- has not been taking for more than a year                          Advir 1 puff bid (asthma - COPD)                          Oxycontin 30 mg ER   (pain) - Pain management -- written by PMD     -- no longer taking (at patients request)     -- but was helping considerably     -- b/o pain in limbs and back that interfere with ability to get around)                          Norco (7.5/325) - 3-4 per day (pain) - Pain management -- written by PMD                          CBD cream (helps with cramping)    Xanax 0.25 mg prn                          Vit D 4000 IU/d     10 Pt ROS (-) ;           Bladder:           ++urgency; incont daily                                                        Bowel:             OK       Brain MRI: Not Done (last    February 2021)    Labs: 11/21/22:      CBC__Ok__ ;     LFTs____;        BMS __Ok__;     IgG ____;       IgM ____     c/o: Not doing well     About a month ago her L eye started "drifting out" and she has double vision    -- initially intermittent bu now more persistent    -- walking into walls     Migraine HA still daily     Getting anxiety attacks     Gets "MS hug" (= intense squeezing sensation around rib cage for 15 min daily)    -- has been having ~6-8 months     Walks 30-40 yards (with help) -- lives on 3rd floor and needs considerable help to go up and down     No attacks               Exam:  Speech is still very halting and nonfluent.  Not aphasic and no difficulty with comprehension.      Assess: MS    Plan:  Restart Copaxone --> please pend order for me     -- discussed possibility of changing DMTs --> for now pt wants Copaxone    Repeat MRI Brain C and T spine    RV 6 months (in-person)      I spent a total of 30 minutes in face-to-face time with the patient and in non-face-to-face activities conducted today 12/26/22 directly related to this video visit,  including reviewing records and tests, obtaining history and exam, placing orders, communicating with other healthcare professionals, counseling the patient, family or caregiver, documenting in the medical record, and/or care coordination for the diagnoses above.

## 2022-12-27 MED ORDER — COPAXONE 40 MG/ML SUBCUTANEOUS SYRINGE
40 | SUBCUTANEOUS | 3 refills | Status: AC
Start: 2022-12-27 — End: ?
  Filled 2023-01-16: qty 12, 28d supply, fill #0

## 2022-12-27 MED ORDER — ALPRAZOLAM 0.25 MG TABLET
0.25 | ORAL_TABLET | ORAL | 0 refills | Status: DC
Start: 2022-12-27 — End: 2023-01-10

## 2023-01-10 MED ORDER — ALPRAZOLAM 0.25 MG TABLET
0.25 | ORAL_TABLET | ORAL | 0 refills | Status: AC
Start: 2023-01-10 — End: ?

## 2023-01-10 NOTE — Telephone Encounter (Signed)
Patient/Caregiver calling for the following reason: Patient calling regarding her medication Presidone, was not sent to correct PX, following up as this was an ongoing issue. Per patient she would like this medication sent to the [facility] in [place].     Preferred mode of communication is Phone number on file.  Preferred phone number:  [phone]    Molli Knock to leave message Yes.    Last appointment:     Next appointment: Visit date not found    [facility]

## 2023-01-12 ENCOUNTER — Telehealth
Admit: 2023-01-12 | Discharge: 2023-01-12 | Payer: Commercial Managed Care - HMO | Attending: Neurology | Primary: Physician

## 2023-01-12 DIAGNOSIS — G35 Multiple sclerosis: Secondary | ICD-10-CM

## 2023-01-12 MED ORDER — SODIUM CHLORIDE 0.9 % INTRAVENOUS SOLUTION
0.9 | Freq: Once | INTRAVENOUS | Status: DC
Start: 2023-01-12 — End: 2023-01-12

## 2023-01-12 NOTE — Progress Notes (Signed)
F/U MS    Copaxone (generic) 40 mg tiw;                                         -- has not been taking for more than a year                          Advir 1 puff bid (asthma - COPD)                          Oxycontin 30 mg ER   (pain) - Pain management -- written by PMD                                      -- no longer taking (at patients request)                                      -- but was helping considerably                                      -- b/o pain in limbs and back that interfere with ability to get around)                          Norco (7.5/325) - 3-4 per day (pain) - Pain management -- written by PMD                          CBD cream (helps with cramping)                          Xanax 0.25 mg prn                          Vit D 4000 IU/d     10 Pt ROS (-) ;           Bladder:           ++urgency; incont daily                                                        Bowel:             OK        Brain MRI:Not Done(last    February 2021) -- ordered     Labs: Not done     c/o: Was given Prednisone for breathing (5 10 mg tabs x 3 days ; then tapered -- 45 tabs all told     Vision definitely better (much less diplopia) but now out of meds     Migraine HA still daily  -- No ?     Getting anxiety attacks  -- No ?     Gets "MS hug" (= intense squeezing sensation around rib cage for 15  min daily)  -- No ?     Walks 30-40 yards (with help) -- lives on 3rd floor and needs considerable help to go up and down  -- No ?     No other episodes      No Exam:    Assess: MS -- probable flare    Plan:  We will still get MRIs    Course of IVSM --> Stark Bray can you pend for me    RV 6 months      I spent a total of 30 minutes in face-to-face time with the patient and in non-face-to-face activities conducted today 01/11/22 directly related to this video visit, including reviewing records and tests, obtaining history and exam, placing orders, communicating with other healthcare professionals, counseling the patient, family or  caregiver, documenting in the medical record, and/or care coordination for the diagnoses above.        ? ; ? ; ? ; ? ; ? ;  ? ;  -  ;  () ;  PERRL  ; to R > to L ; Weber ? L ; VFF to red  ;   25 Ft W = ? s ; B(=) ; (R>L) ;  (R=L) ; tandem w/ diff but able for > ? steps ;  c/w age ; mild ?  ;  absent to ankles ;  (corrected for distance) ; (+) Urgency ; (+) Incont - ?/wk ;   (+) Const.  ;  No ?   --/--   ?     --/--  ;  (??% v. 100%) ;    T:  nl ;  Pos:  nl ; Direction:  nl.  Vib: UE:  sl ?  (R=L) ;     LE:  sl ? (R=L)  ;    LE > UE

## 2023-01-13 NOTE — Progress Notes (Signed)
[facility]  Comprehensive Medication Review      Spoke with Patient  who agreed to a comprehensive medication review.     Is patient enrolled in Patient Management Program?   Yes,enrolled.                                                                                                                                                                                                                                                                                                                         Any Changes/Updates to Patient Allergies or Health Conditions: No  Any Changes/Updates to Patient Contacts or Living Arrangements  (Emergency Contacts, Phone/Address Change, Refrigerator Issues): No     Assessment and Plan   - Patient is taking  glatiramer (Copaxone)  Prefilled Syringe  40mg   three times a week  for treatment of Multiple Sclerosis (MS). The medication, dose, directions, and duration of therapy are clinically appropriate for the patient diagnosis per [employer].  - Reassessment for restart of Copaxone 40mg  three times a week. Consult declined, patient was off medication for a year. Now restarting and still familiar with medication.    Next Follow-up/Reassessment: Patient is starting new therapy, re-evaluate efficacy and tolerability in 6 month(s)    Estimated total time spent: 10 to 15 minutes    Wendy Pena Randa Evens, PharmD  [facility]  [phone]    Notes:  1. Purpose of call: new treatment start, scheduled reassessment, reassessment for change in dose/dosage form. Document in [employer] HIPAA (DOB/address)  2. Medication name, dosage form, dosing and diagnosis (MS). Assess  medication appropriateness for patient, cite reference.   3. Evaluate medication therapy indication/efficacy/progress. Document in [employer] if medication is helping and specify how so   4. Evaluate medication therapy adherence.  5. Evaluate medication therapy safety, side effects and safety labs (may use smartphrase .lastlabname CBC, HCV, HBC, etc)  No results found for: "CBC" (No results found for: "LYMPHABSEXL", "TABSEXL", "CD4EXL", "LS2", "CD20A"   Alanine transaminase   Date Value Ref Range Status   08/21/2013 231 (H) 11 - 50 U/L Final     AST   Date Value Ref Range Status   08/21/2013 98 (H) 17 - 42 U/L Final     Alkaline Phosphatase   Date Value Ref Range Status   08/21/2013 66 31 - 95 U/L Final     Bilirubin, Total   Date Value Ref Range Status   08/21/2013 0.6 0.2 - 1.3 mg/dL Final     Gamma-Glutamyl Transpeptidase   Date Value Ref Range Status   05/29/2012 22 7 - 37 U/L Final    No results found for: "BUN", "CREATININE", "CREAT", "CWB", "CREATI" CrCl cannot be calculated (No successful lab value found.). No results found for: "QFTCRITEXL", "QPFN", "QTFN" No results found for: "HBSAG", "HBCAB", "HCV", "HIVAA1", "HIVRL")  6. Review care plan and goals of therapy with patient. Document in [place] any interventions.  7. Document in [employer] quality metric ratings.

## 2023-01-24 MED FILL — GLATIRAMER 40 MG/ML SUBCUTANEOUS SYRINGE: 40 40 mg/mL | SUBCUTANEOUS | 28 days supply | Qty: 12 | Fill #0 | Status: CN

## 2023-02-04 MED FILL — GLATIRAMER 40 MG/ML SUBCUTANEOUS SYRINGE: 40 40 mg/mL | SUBCUTANEOUS | 28 days supply | Qty: 12 | Fill #0 | Status: CN

## 2023-02-17 MED FILL — GLATIRAMER 40 MG/ML SUBCUTANEOUS SYRINGE: 40 40 mg/mL | SUBCUTANEOUS | 28 days supply | Qty: 12 | Fill #0 | Status: CN

## 2023-03-20 ENCOUNTER — Telehealth: Payer: Self-pay | Admitting: Radiation Oncology

## 2023-03-20 NOTE — Telephone Encounter (Signed)
Called patient to schedule a consultation w. Dr. Squire. No answer, LVM for a return call.  

## 2023-03-24 ENCOUNTER — Telehealth: Payer: Self-pay | Admitting: Radiation Oncology

## 2023-03-24 NOTE — Progress Notes (Signed)
Oncology Nurse Navigator Documentation   Placed introductory call to new referral patient Josselyn Burst. Introduced myself as the H&N oncology nurse navigator that works with Dr. Basilio Cairo to whom she has been referred by Dr. Lurlean Leyden. She confirmed understanding of referral. Briefly explained my role as her navigator, provided my contact information.  Confirmed understanding of upcoming appts and [facility] location, explained arrival and registration process. I encouraged her to call with questions/concerns as she moves forward with appts and procedures.   She verbalized understanding of information provided, expressed appreciation for my call.   Navigator Initial Assessment Employment Status: unknown Currently on FMLA / STD: na Living Situation: She lives with her husband Support System: family PCP: unknown PCD: she is edentulous Financial Concerns: no Transportation Needs: no Sensory Deficits: no Chiropodist Needed:  no Ambulation Needs: no Psychosocial Needs:  no Concerns/Needs Understanding Cancer:  addressed/answered by navigator to best of ability Self-Expressed Needs: no   Tourist information centre manager, BSN, OCN Head & Neck Oncology Nurse Navigator [facility] Phone # [phone]  Fax # [phone]

## 2023-03-24 NOTE — Telephone Encounter (Signed)
Called patient to r/s consultation w. Dr. Basilio Cairo for a sooner appointment. No answer, LVM for a return call.

## 2023-03-27 ENCOUNTER — Telehealth: Payer: Self-pay

## 2023-03-27 ENCOUNTER — Encounter: Payer: Self-pay | Admitting: Radiation Oncology

## 2023-03-27 NOTE — Telephone Encounter (Signed)
RN called pt for meaningful use and nurse evaluation information. Consult note completed and routed to Dr. Basilio Cairo and Quitman Livings PA-C for review.

## 2023-03-27 NOTE — Progress Notes (Signed)
Radiation Oncology         [phone] ________________________________  Initial Outpatient Consultation  Name: Meghan Ellis MRN: [id number]  Date: 03/28/2023  DOB: 1965/12/10  CC:No primary care provider on file.  Louie Casa, MD   REFERRING PHYSICIAN: Louie Casa, MD  DIAGNOSIS:    ICD-10-CM   1. Malignant neoplasm of border of tongue [facility])  C02.1        Cancer Staging  No matching staging information was found for the patient.  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 female who initially presented to her PCP this past March with c/o tongue soreness while eating and a new small white ulceration on her tongue. She initially attributed her symptoms and ulceration to a canker sore and was given several oral rinses by her PCP without much improvement.   She was subsequently referred to Dr. Jeanice Lim at [facility] and underwent biopsies of the ventral tongue on 12/30/22. Pathology showed findings consistent with invasive well-differentiated squamous cell carcinoma (keratinizing), positive for PNI, negative for LVI; tumor present at both lateral margins and focally at the deep margin   She was accordingly referred to Dr. Moshe Cipro at [facility] general surgery on 02/08/23. Oral examination performed at that time revealed an endophytic tumor measuring 28 mm in the ventral anterior left oral tongue (very close to the tip of the tongue) extending into the ventral surface of the tongue. No lymphadenopathy was noted. A laryngoscopy was also performed which was unremarkable.   Based on Dr. Ina Homes recommendation, the patient made an informed decision to proceed with a left glossectomy on 02/24/23. Pathology from the procedure revealed: tumor the size of 2.0 x 1.5 cm;  histology of invasive moderately differentiated squamous cell carcinoma with a max depth of invasion of approximately 1.4 cm; positive for PNI; negative for LVI; all margins negative for carcinoma. No lymph nodes were examined.   Pertinent imaging thus far includes a PET/CT scan on 02/21/23 which demonstrated: hypermetabolic thickening of the anterior left oral tongue measuring about 2.8 x 2.4 cm with a max SUV of 11.6; associated diffuse thickening of adjacent lip; and an asymmetric focus of increased FDG activity in the left glossotonsillar sulcus with a max SUV of 6.5 without any CT correlate. PET otherwise showed no hypermetabolic cervical lymphadenopathy and no evidence of hypermetabolic distant metastatic disease.   She was seen in consultation by Dr. Lurlean Leyden at [facility] radiation oncology on 03/15/23. Given that she lives in [place], she was referred to our facility so that she can receive treatment closer to her home. In terms of dosing, Dr. Lurlean Leyden recommends IMRT consisting of 60 Gy in 30 fractions to her high risk disease, and a dose of 54 Gy in 30 fractions to her intermediate risk disease. Dr. Lurlean Leyden has also discussed the indication of PEG tube placement to help maintain good nutritional status during treatment.   Swallowing issues, if any: some dysphagia caused by her tongue   Weight Changes: lost about 7-10 lbs after surgery   Pain status: ***  Other symptoms: presented with tongue soreness while eating and a small white ulceration on her tongue  Tobacco history, if any: smoked 1/2 ppd for the last 40 years  and recently quite smoking on 02/24/2023 after her surgery   ETOH abuse, if any: No  significant recent alcohol consumption but has a past history of heavy drinking  Prior cancers, if any: none    Referrals Yes No Comments  Social Work? []   [x]      Dentistry? [x]   []   A year ago but was not pleased with dentures which makes it difficult to eat at times.    Swallowing therapy? [x]   []      Nutrition? [x]   []      Med/Onc? []   [x]        Safety Issues Yes No Comments  Prior radiation? []   [x]      Pacemaker/ICD? []   [x]      Possible current pregnancy? []   [x]      Is the patient on methotrexate? []   [x]        PREVIOUS RADIATION THERAPY: No  PAST MEDICAL HISTORY:  has a past medical history of Anxiety.    PAST SURGICAL HISTORY:*** The histories are not reviewed yet. Please review them in the "History" navigator section and refresh this SmartLink.  FAMILY HISTORY: family history is not on file.  SOCIAL HISTORY:    ALLERGIES: Hydrocodone  MEDICATIONS:  Current Outpatient Medications  Medication Sig Dispense Refill   Multiple Vitamins-Minerals (B COMPLEX-C-E-ZINC) tablet Take 1 tablet by mouth daily.     XANAX 1 MG tablet Take 1 mg by mouth daily as needed for anxiety.     No current facility-administered medications for this encounter.    REVIEW OF SYSTEMS:  Notable for that above.   PHYSICAL EXAM:  vitals were not taken for this visit.   General: Alert and oriented, in no acute distress HEENT: Head is normocephalic. Extraocular movements are intact. Oropharynx is notable for ***. Neck: Neck is notable for *** Heart: Regular in rate and rhythm with no murmurs, rubs, or gallops. Chest: Clear to auscultation bilaterally, with no rhonchi, wheezes, or rales. Abdomen: Soft, nontender, nondistended, with no rigidity or guarding. Extremities: No cyanosis or edema. Lymphatics: see Neck Exam Skin: No concerning lesions. Musculoskeletal: symmetric strength and muscle tone throughout. Neurologic: Cranial nerves II through XII are grossly intact. No obvious focalities. Speech is fluent. Coordination is intact. Psychiatric: Judgment and insight are intact. Affect is appropriate.   ECOG = ***  0 - Asymptomatic (Fully active, able to carry on all predisease activities without restriction)  1 - Symptomatic but completely ambulatory  (Restricted in physically strenuous activity but ambulatory and able to carry out work of a light or sedentary nature. For example, light housework, office work)  2 - Symptomatic, <50% in bed during the day (Ambulatory and capable of all self care but unable to carry out any work activities. Up and about more than 50% of waking hours)  3 - Symptomatic, >50% in bed, but not bedbound (Capable of only limited self-care, confined to bed or chair 50% or more of waking hours)  4 - Bedbound (Completely disabled. Cannot carry on any self-care. Totally confined to bed or chair)  5 - Death   Santiago Glad MM, Creech RH, Tormey DC, et al. [phone]). "Toxicity and response criteria of the [facility]". Am. Evlyn Clines. Oncol. 5 (6): 649-55   LABORATORY DATA:  No results found for: "WBC", "HGB", "HCT", "MCV", "PLT" CMP  No results found for: "NA", "K", "CL", "CO2", "GLUCOSE", "BUN", "CREATININE", "CALCIUM", "PROT", "ALBUMIN", "AST", "ALT", "ALKPHOS", "BILITOT", "GFRNONAA", "GFRAA"    No results found for: "TSH"   RADIOGRAPHY: No results found.    IMPRESSION/PLAN:  This is a delightful patient with head and  neck cancer. I *** recommend radiotherapy for this patient.  We discussed the potential risks, benefits, and side effects of radiotherapy. We talked in detail about acute and late effects. We discussed that some of the most bothersome acute effects may be mucositis, dysgeusia, salivary changes, skin irritation, hair loss, dehydration, weight loss and fatigue. We talked about late effects which include but are not necessarily limited to dysphagia, hypothyroidism, nerve injury, vascular injury, spinal cord injury, xerostomia, trismus, neck edema, dental issues, non-healing wound, and potentially fatal injury to any of the tissues in the head and neck region. No guarantees of treatment were given. A consent form was signed and placed in the patient's medical record. The patient is enthusiastic  about proceeding with treatment. I look forward to participating in the patient's care.    Simulation (treatment planning) will take place ***  We also discussed that the treatment of head and neck cancer is a multidisciplinary process to maximize treatment outcomes and quality of life. For this reason the following referrals have been or will be made:  *** Medical oncology to discuss chemotherapy   *** Dentistry for dental evaluation, possible extractions in the radiation fields, and /or advice on reducing risk of cavities, osteoradionecrosis, or other oral issues.  *** Nutritionist for nutrition support during and after treatment.  *** Speech language pathology for swallowing and/or speech therapy.  *** Social work for social support.   *** Physical therapy due to risk of lymphedema in neck and deconditioning.  *** Baseline labs including TSH.  On date of service, in total, I spent *** minutes on this encounter. Patient was seen in person.  __________________________________________   Lonie Peak, MD  This document serves as a record of services personally performed by Lonie Peak, MD. It was created on her behalf by Neena Rhymes, a trained medical scribe. The creation of this record is based on the scribe's personal observations and the provider's statements to them. This document has been checked and approved by the attending provider.

## 2023-03-27 NOTE — Progress Notes (Signed)
Head and Neck Cancer Location of Tumor / Histology: carcinoma of tongue Pet scan on 02-21-23  Patient presented with symptoms of:  She noted that around March 1st of this year, she started developing soreness of her tongue when she was eating. She noted a small white ulceration of her tongue and thought it was a canker sore. This did not go away so she saw her PCP who prescribed some rinses but recommended she see a dentist for evaluation.   Biopsies revealed:  Surgical Pathology                                Case: [id number]                                 Authorizing Provider:  Corinna Gab, MD           Collected:           02/24/2023 0749             Ordering Location:     [facility]    Received:            02/24/2023 0758             Pathologist:           Arvilla Market, MD                                                       Intraop:               Rosemary Holms, MD                                                       Specimens:   1) - Tongue, Biopsy, tongue- anterior deep margin. FROZEN                                          2) - Tongue, Partial Glossectomy, left tongue lesion. long stitch anterior. short                  stitch posterior.                                                                                  3) - Tongue, Biopsy, ventral margin. FROZEN  4) - Tongue, Biopsy, dorsal margin. FROZEN                                                        5) - Tongue, Biopsy, deep posterior margin. FROZEN.                                   [facility]   Addendum 1  [facility]  NEGATIVE for p16 by immunoperoxidase stain. Positive controls showed appropriate reactivity.  Addendum electronically signed by Arvilla Market, MD on 03/02/2023 at  4:00 PM   Final Diagnosis  [facility]  Tongue, anterior deep margin, excision:              Negative for malignancy.               Skeletal muscle identified.   2.   Tongue/left tongue lesion, excision:              Invasive moderately differentiated squamous cell carcinoma.              Tumor measures 2.0 x 1.5 cm in greatest horizontal dimension.              Maximum depth of invasion is approximately 1.4 cm (14 mm).              Perineural invasion by carcinoma is present.              Negative for angiolymphatic involvement.              Final examined surgical margins appear free of involvement.              Pathologic stage: pT3   3.   Tongue, ventral margin, excision:              Negative for malignancy.              Benign squamous mucosa.   4.   Tongue, dorsal margin, excision:              Negative for malignancy.              Benign squamous mucosa with reactive changes.   5.   Tongue, deep posterior margin, excision:              Negative for malignancy.              Skeletal muscle identified.    Electronically signed by Arvilla Market, MD on 02/27/2023 at 12:10 PM   Synoptic Report ORAL CAVITY ORAL CAVITY - 2 8th Edition - Protocol posted: 02/02/2022  SPECIMEN    Procedure:    Glossectomy: Left side of tongue  TUMOR    Tumor Focality:    Unifocal    Tumor Site:    Left anterior ventral tongue.    Tumor Laterality:    Left    Tumor Size:    Greatest Dimension (Centimeters): 2.5 cm      Additional Dimension (Centimeters):    1.5 cm    Histologic Type:          :    Squamous cell carcinoma, conventional (keratinizing)    Histologic Grade:    G2, moderately differentiated  Tumor Depth of Invasion (DOI):    14 mm    Lymphatic and / or Vascular Invasion:    Not identified    Perineural Invasion:    Present    Worst Pattern of Invasion (WPOI):    WPOI 1-4  MARGINS    Specimen Margin Status for Invasive Tumor:    All specimen margins negative for invasive tumor      Distance from Invasive Tumor to Closest Specimen Margin:    3 mm      Closest Specimen Margin(s) to Invasive Tumor:     Dorsal lateral mucosal surface    Tumor Bed Margin Status:          Tumor Bed Margin Orientation:    Unoriented to true margin surface      Tumor Bed Margin Status for Invasive Tumor:    All tumor bed margins negative for invasive tumor        Distance from Invasive Tumor to True Margin Surface:    Cannot be determined  REGIONAL LYMPH NODES    Regional Lymph Node Status:    Not applicable (no regional lymph nodes submitted or found)  pTNM CLASSIFICATION (AJCC 8th Edition)    Reporting of pT, pN, and (when applicable) pM categories is based on information available to the pathologist at the time the report is issued. As per the AJCC (Chapter 1, 8th Ed.) it is the managing physician's responsibility to establish the final pathologic stage based upon all pertinent information, including but potentially not limited to this pathology report.    pT Category:    pT3    pN Category:    pN not assigned (no nodes submitted or found) [facility]  Diagnosis Comment  [facility]    Tumor was seen approaching within 1 mm of the posterior margin of the main excisional specimen (specimen #2).  The separately submitted deep posterior margin (specimen 5) was negative for involvement.  Clinical Information Post-op Diagnosis: TONGUE CARCINOMA (C02.9)     Nutrition Status Yes No Comments  Weight changes? [x]  []  Yes, lost weight with hospitalization with tongue surgery, not sure of how much but thinks about 10 lbs,   Swallowing concerns? [x]  []  Tongue makes it difficult at times now  PEG? []  [x]     Referrals Yes No Comments  Social Work? []  [x]    Dentistry? [x]  []  A year ago but was not pleased with dentures which makes it difficult to eat at times.   Swallowing therapy? [x]  []    Nutrition? [x]  []    Med/Onc? []  [x]     Safety Issues Yes No Comments  Prior radiation? []  [x]    Pacemaker/ICD? []  [x]    Possible current pregnancy? []  [x]    Is the patient on methotrexate? []  [x]      Tobacco/Marijuana/Snuff/ETOH use:  Quit: 02/24/2023   Past/Anticipated interventions by otolaryngology, if any:  Corinna Gab, MD 02-24-23 (General surgery) Diagnoses / Procedures Referred By Contact Referred To Contact    Diagnoses  TONGUE CARCINOMA (C02.9)    Procedures  CPT NOT MAPPED  TONGUE AND NECK SURGERY  PARTIAL GLOSSECTOMY      Past/Anticipated interventions by medical oncology, if any:  Louie Casa, MD  (Rad onc at [facility]) 03-15-23  Diagnosis Taiwo Bode is a 57 y.o. female with a pT3Nx cN0M0, stage III, squamous cell carcinoma of the anterior left oral tongue s/p R0 partial glossectomy on 02/24/23 with perineural invasion noted.  History of Present Illness  Meghan Stigall  Ellis is a 57 y.o. female seen today in consultation at the request of Dr. Moshe Cipro for an opinion regarding the use of radiation therapy in the treatment of her oral cavity cancer.   She noted that around March 1st of this year, she started developing soreness of her tongue when she was eating. She noted a small white ulceration of her tongue and thought it was a canker sore. This did not go away so she saw her PCP who prescribed some rinses but recommended she see a dentist for evaluation.  She saw the dental office who extracted her teeth previously, but they referred her to another dentist. Eventually, she underwent biopsy by Dr. Jeanice Lim (DDS) on 01/30/23 of the ventral tongue. Pathology showed an invasive, keratinizing, well-differentiated squamous cell carcinoma Tumor was present at both lateral margins and focally at the deep margin. There was no evidence of lympho-vascular space invasion; however, perineural invasion was prominent.  She was then seen by Dr. Moshe Cipro on 02/07/23. On exam, he noted an endophytic tumor measuring ~28 mm in greatest dimension on the ventral anterior left oral tongue very close to the tip of the tongue extending into the ventral surface of the tongue. It did not extend  to midline. No lymphadenopathy was noted. He recommended a staging with a PET CT. If PET was negative in the neck, then he recommended wide local excision without a neck dissection since she would need adjuvant radiation anyway due to the perineural invasion.  PET CT on 02/21/23 showed hypermetabolic thickening of the anterior left oral tongue measuring about 2.8 x 2.4 cm with a max SUV of 11.6. There was associated diffuse thickening of adjacent lip. There was asymmetric focus of increased FDG activity in the left glossotonsillar sulcus with a max SUV of 6.5 without any CT correlate.. There was no hypermetabolic cervical lymphadenopathy. There was no evidence of hypermetabolic distant metastatic disease.  Dr. Moshe Cipro then performed a partial glossectomy on 02/24/23. Pathology showed a 2.0 x 1.5 cm moderately differentiated squamous cell carcinoma with a depth of invasion of 14 mm. There was no lympho-vascular space invasion but perineural invasion was appreciated. Margins were free of involvement by 3 mm.  Today, she is doing well overall. She states that she has lost about 7-10 lbs after surgery, but over the last 5 days, she has been able to increase her caloric intake more. She is blending her garden vegetables with ground beef or chicken breast and drinking this multiple times a day. She has smoked 0.5 PPD for the last 40 years, but she has not smoked since her surgery.  Oncology History  Tongue carcinoma (*)  01/30/2023 Initial Diagnosis  Tongue carcinoma (*)  02/24/2023 Cancer Staged  Staging form: Oral Cavity, AJCC 8th Edition - Pathologic stage from 02/24/2023: No Stage Recommended (pT3, cN0, cM0) - Signed by Louie Casa, MD on 03/15/2023   02/24/2023 Surgery  s/p R0 partial glossectomy   Assessment  Oral cavity cancer (pT3Nx cN0M0, stage III, squamous cell carcinoma of the anterior left oral tongue) s/p R0 partial glossectomy on 02/24/23  Plan Treatment options were discussed with the  patient including chemotherapy, radiation therapy and observation.  The risks, benefits, scheduling, alternatives to and rationale of radiation therapy were explained in detail.  I discussed the potential acute and late toxicities associated with treatment. I discussed with the patient that she the following risk factors at the time of surgery: T3 tumor and perineural invasion  Due to these risk factors, I  recommended to proceed with adjuvant radiation therapy for the treatment of her oral tongue cancer. After this discussion, she wanted to proceed with treatment. However, she notes that she lives on the [place] of [place] and wonders if it would be more feasible to get her radiation closer to home. I have referred her to [facility] for consultation. She would like to meet with them and see if she has a good interaction with them before deciding on where she will get treatment. I discussed that I have no issues with this referral, and if for any reason, she wants to come back here after she meets with them, then I will be happy to see her again and treat her. I discussed that I would recommend a medical oncology consultation as well to evaluate for concurrent chemotherapy as NCCN recommends consideration of concurrent treatment with adverse features to include pT3 tumor and perineural invasion. She does not meet strict guidelines of needing chemotherapy without a positive margin or extracapsular extension, but I would appreciate input from Dr. Belva Crome on the need or lack thereof before proceeding if she chooses to get treatment here. I would plan to treat her her high risk disease to a dose of 60 Gy in 30 fractions and her intermediate risk disease to a dose of 54 Gy in 30 fractions should she come here for radiation. I deem IMRT to be medically necessary for her radiation therapy due to the proximity of the target volume to organs at risk, including but not limited to, the bilateral parotids, oral  cavity, mandible, brainstem, and optic structures. A concave dose distribution will need to be formulated to stay below tolerance doses. I would utilize daily image guidance to ensure appropriate target coverage since tight margins will be utilized to limit the radiation dose to normal tissues. I did discuss PEG tube placement to help maintain good nutritional status during treatment. The patient had the opportunity to have all of her questions answered regarding her diagnosis and proposed treatment.  All questions were answered to the best of my ability.  She was given our contact information, and she was told that she could call our clinic at any time if she has any questions or concerns. I would be more than happy to see her back at any time if she needs to be seen in our department.   Kelli A. Lurlean Leyden, MD Radiation Oncology   Current Complaints / other details: Pt does not like going to the doctor overall and is really unsure of radiation. Pt is afraid she cannot finish treatment, pt cousin had head/neck radiation (had horrible experience and could not finish).

## 2023-03-28 ENCOUNTER — Other Ambulatory Visit: Payer: Self-pay

## 2023-03-28 ENCOUNTER — Ambulatory Visit
Admission: RE | Admit: 2023-03-28 | Discharge: 2023-03-28 | Disposition: A | Discharge status: Home / Self Care | Payer: 59 | Source: Ambulatory Visit | Attending: Radiation Oncology | Admitting: Radiation Oncology

## 2023-03-28 VITALS — BP 125/56 | HR 110 | Temp 97.8°F | Resp 18 | Ht 66.0 in | Wt 142.4 lb

## 2023-03-28 DIAGNOSIS — R5381 Other malaise: Secondary | ICD-10-CM

## 2023-03-28 DIAGNOSIS — Z87891 Personal history of nicotine dependence: Secondary | ICD-10-CM | POA: Insufficient documentation

## 2023-03-28 DIAGNOSIS — C021 Malignant neoplasm of border of tongue: Secondary | ICD-10-CM | POA: Insufficient documentation

## 2023-03-28 DIAGNOSIS — I779 Disorder of arteries and arterioles, unspecified: Secondary | ICD-10-CM | POA: Insufficient documentation

## 2023-03-28 DIAGNOSIS — R1310 Dysphagia, unspecified: Secondary | ICD-10-CM | POA: Insufficient documentation

## 2023-03-28 HISTORY — DX: Anxiety disorder, unspecified: F41.9

## 2023-03-28 NOTE — Progress Notes (Signed)
Oncology Nurse Navigator Documentation   Met with patient during initial consult with Dr. Basilio Cairo.  Further introduced myself as her Navigator, explained my role as a member of the Care Team. I toured her to the [facility] treatment area, explained procedures for lobby registration, arrival to Radiation Waiting, and arrival to treatment area.    She knows that she will be called by our CT simulation schedulers if they can move her CT simulation to an earlier date.  She verbalized understanding of information provided. I encouraged her to call with questions/concerns moving forward.  Hedda Slade, RN, BSN, OCN Head & Neck Oncology Nurse Navigator [facility] at [place] [phone]

## 2023-03-29 ENCOUNTER — Encounter: Payer: Self-pay | Admitting: Radiation Oncology

## 2023-03-30 ENCOUNTER — Telehealth: Payer: Self-pay | Admitting: Radiation Oncology

## 2023-03-30 NOTE — Telephone Encounter (Signed)
Pt called stating she was returning a missed call from yesterday. I attempted to call CT SIM and RN Victorino Dike to see if they called, no answer. No communication note found from 8/14. Pt was advised of this, she stated she would c/b after checking to see if there was a VM.

## 2023-03-31 ENCOUNTER — Other Ambulatory Visit: Payer: Self-pay

## 2023-03-31 ENCOUNTER — Inpatient Hospital Stay
Admission: RE | Admit: 2023-03-31 | Discharge: 2023-03-31 | Disposition: A | Discharge status: Home / Self Care | Payer: Self-pay | Source: Ambulatory Visit | Attending: Radiation Oncology | Admitting: Radiation Oncology

## 2023-03-31 DIAGNOSIS — C021 Malignant neoplasm of border of tongue: Secondary | ICD-10-CM

## 2023-04-05 ENCOUNTER — Ambulatory Visit
Admission: RE | Admit: 2023-04-05 | Discharge: 2023-04-05 | Disposition: A | Discharge status: Home / Self Care | Payer: 59 | Source: Ambulatory Visit | Attending: Radiation Oncology | Admitting: Radiation Oncology

## 2023-04-05 ENCOUNTER — Ambulatory Visit: Payer: Self-pay

## 2023-04-05 ENCOUNTER — Ambulatory Visit: Payer: Self-pay | Admitting: Radiation Oncology

## 2023-04-05 DIAGNOSIS — C023 Malignant neoplasm of anterior two-thirds of tongue, part unspecified: Secondary | ICD-10-CM | POA: Diagnosis present

## 2023-04-05 DIAGNOSIS — Z87891 Personal history of nicotine dependence: Secondary | ICD-10-CM | POA: Insufficient documentation

## 2023-04-05 DIAGNOSIS — Z510 Encounter for antineoplastic radiation therapy: Secondary | ICD-10-CM | POA: Diagnosis present

## 2023-04-05 DIAGNOSIS — F1011 Alcohol abuse, in remission: Secondary | ICD-10-CM | POA: Insufficient documentation

## 2023-04-05 NOTE — Progress Notes (Signed)
Oncology Nurse Navigator Documentation   To provide support, encouragement and care continuity, met with Ms. Terrero after her CT [facility]. She was accompanied by her husband. She tolerated procedure without difficulty, denied questions/concerns.   I encouraged her to call me prior to her 04/10/23 [facility]   Hedda Slade RN, BSN, OCN Head & Neck Oncology Nurse Navigator [facility] at [facility] Phone # [phone]  Fax # [phone]

## 2023-04-07 DIAGNOSIS — Z510 Encounter for antineoplastic radiation therapy: Secondary | ICD-10-CM | POA: Diagnosis not present

## 2023-04-10 ENCOUNTER — Ambulatory Visit: Payer: Self-pay | Admitting: Radiation Oncology

## 2023-04-10 ENCOUNTER — Ambulatory Visit: Payer: 59

## 2023-04-11 ENCOUNTER — Other Ambulatory Visit: Payer: Self-pay

## 2023-04-11 ENCOUNTER — Ambulatory Visit
Admission: RE | Admit: 2023-04-11 | Discharge: 2023-04-11 | Disposition: A | Discharge status: Home / Self Care | Payer: 59 | Source: Ambulatory Visit | Attending: Radiation Oncology | Admitting: Radiation Oncology

## 2023-04-11 ENCOUNTER — Inpatient Hospital Stay: Payer: 59 | Admitting: Dietician

## 2023-04-11 ENCOUNTER — Telehealth: Payer: Self-pay | Admitting: Dietician

## 2023-04-11 DIAGNOSIS — R5383 Other fatigue: Secondary | ICD-10-CM

## 2023-04-11 DIAGNOSIS — C021 Malignant neoplasm of border of tongue: Secondary | ICD-10-CM

## 2023-04-11 DIAGNOSIS — Z510 Encounter for antineoplastic radiation therapy: Secondary | ICD-10-CM | POA: Diagnosis not present

## 2023-04-11 LAB — RAD ONC ARIA SESSION SUMMARY
Course Elapsed Days: 0
Plan Fractions Treated to Date: 1
Plan Prescribed Dose Per Fraction: 2 Gy
Plan Total Fractions Prescribed: 30
Plan Total Prescribed Dose: 60 Gy
Reference Point Dosage Given to Date: 2 Gy
Reference Point Session Dosage Given: 2 Gy
Session Number: 1

## 2023-04-11 LAB — TSH: TSH: 2.19 u[IU]/mL (ref 0.350–4.500)

## 2023-04-11 NOTE — Telephone Encounter (Signed)
Attempted to reach patient for a scheduled remote nutrition consult. Provided my cell# on voice mail of mobile which is spouse# to return call for her nutrition consult. Home# was answered by voice mail of an injury lawyer which I suspect is not the right #.  Gennaro Africa, RDN, LDN Registered Dietitian, [facility] Part Time Remote (Usual office hours: Tuesday-Thursday) Cell: [phone]

## 2023-04-11 NOTE — Progress Notes (Signed)
Nutrition Assessment: Patient returned call from her mobile number and provided corrected home# [phone] which she prefers as signal is usually better..    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  She is being followed by Dr. Basilio Cairo and is aware she will ultimately transition to weekly in-person nutrition follow up in person.  She reports she is improving her intake and her ability to tolerate and maneuver textures in foods.  She has been blending all food s/p her surgery and she is increasing calories in an effect to regain some LBM and strength.  She reports she has gained weight at home on the scale she just bought.  Tries to "eat clean" Grows much of her foods in garden started added chicken and ground beef & blending into soups/stews Post surgery she was blending most of her foods for fear of swallowing/choking with textures and learning how to maneuver foods. Fish easy to eat, soft, loves raw oysters recently had some trouble maneuvering and got a piece of shell stuck. Also likes Bananas, melons, coconut milk ice cream full fat [place] yogurt, soups with homemade broth bases or Amy's soup products, loves adding lentils  to soups or stews, or extra chicken or ground beef  has been using a Soylent Shake QD (30g protein)   Nutrition Focused Physical Exam: unable to perform NFPE   Medications: taking liquid MVI   Labs: reviewed 04/11/23   Anthropometrics: reports loss of 10# post surgery  Height: 66" Weight:  03/28/23  142.4# UBW: 135-145# (did get up to 170# at one point post menopause BMI: 22.98   Estimated Energy Needs  Kcals: 2000-2300 Protein: 80-100 Fluid: 2.5 L   NUTRITION DIAGNOSIS: Inadequate PO intake to meet increased nutrient needs with cancer diagnosis and  treatment  INTERVENTION:   Relayed that nutrition services are wrap around service provided at no charge and encouraged continued communication if experiencing any nutritional impact symptoms (NIS). Educated on importance of adequate nourishment with calorie and protein energy intake with nutrient dense foods when possible to maintain weight/strength and QOL.   Discussed ways to add calories/protein to foods (nut butters, avocado, creamy sauces/gravy)  Encouraged soft moist high protein foods as well as small frequent meals/snacks -  Suggested continue oral nutrition supplement QD Discussed strategies for protein pacing for return LBM and strength Provided contact information provided.  MONITORING, EVALUATION, GOAL: weight trends, nutrition impact symptoms, PO intake, labs  Goal weight maintenance (or no more than 2-4#/ month gain)  Next Visit: Remote next week  Gennaro Africa, RDN, LDN Registered Dietitian, [facility] Part Time Remote (Usual office hours: Tuesday-Thursday) Mobile: [phone]

## 2023-04-11 NOTE — Progress Notes (Signed)
Oncology Nurse Navigator Documentation   To provide support, encouragement and care continuity, met with Meghan Ellis for her initial RT.   I reviewed the 2-step treatment process, answered questions.  Ms. Lillis completed treatment without difficulty, denied questions/concerns. I brought her up to the main lobby for her TSH lab.  I reviewed the registration/arrival procedure for subsequent treatments. I encouraged her to call me with questions/concerns as treatments proceed.   Hedda Slade RN, BSN, OCN Head & Neck Oncology Nurse Navigator [facility] Phone # [phone]  Fax # [phone]

## 2023-04-12 ENCOUNTER — Ambulatory Visit
Admission: RE | Admit: 2023-04-12 | Discharge: 2023-04-12 | Disposition: A | Discharge status: Home / Self Care | Payer: 59 | Source: Ambulatory Visit | Attending: Radiation Oncology | Admitting: Radiation Oncology

## 2023-04-12 ENCOUNTER — Other Ambulatory Visit: Payer: Self-pay

## 2023-04-12 DIAGNOSIS — Z510 Encounter for antineoplastic radiation therapy: Secondary | ICD-10-CM | POA: Diagnosis not present

## 2023-04-12 LAB — RAD ONC ARIA SESSION SUMMARY
Course Elapsed Days: 1
Plan Fractions Treated to Date: 2
Plan Prescribed Dose Per Fraction: 2 Gy
Plan Total Fractions Prescribed: 30
Plan Total Prescribed Dose: 60 Gy
Reference Point Dosage Given to Date: 4 Gy
Reference Point Session Dosage Given: 2 Gy
Session Number: 2

## 2023-04-13 ENCOUNTER — Ambulatory Visit
Admission: RE | Admit: 2023-04-13 | Discharge: 2023-04-13 | Disposition: A | Discharge status: Home / Self Care | Payer: 59 | Source: Ambulatory Visit | Attending: Radiation Oncology | Admitting: Radiation Oncology

## 2023-04-13 ENCOUNTER — Inpatient Hospital Stay: Payer: 59

## 2023-04-13 ENCOUNTER — Other Ambulatory Visit: Payer: Self-pay

## 2023-04-13 DIAGNOSIS — Z510 Encounter for antineoplastic radiation therapy: Secondary | ICD-10-CM | POA: Diagnosis not present

## 2023-04-13 LAB — RAD ONC ARIA SESSION SUMMARY
Course Elapsed Days: 2
Plan Fractions Treated to Date: 3
Plan Prescribed Dose Per Fraction: 2 Gy
Plan Total Fractions Prescribed: 30
Plan Total Prescribed Dose: 60 Gy
Reference Point Dosage Given to Date: 6 Gy
Reference Point Session Dosage Given: 2 Gy
Session Number: 3

## 2023-04-13 NOTE — Progress Notes (Signed)
[facility] Clinical Social Work  Initial Assessment   Meghan Ellis is a 57 y.o. year old female contacted by phone. Clinical Social Work was referred by nurse navigator for assessment of psychosocial needs.   SDOH (Social Determinants of Health) assessments performed: Yes   SDOH Screenings   Food Insecurity: No Food Insecurity (04/13/2023)  Housing: Low Risk  (04/13/2023)  Transportation Needs: No Transportation Needs (04/13/2023)  Utilities: Not At Risk (04/13/2023)  Depression (PHQ2-9): Low Risk  (04/13/2023)  Social Connections: Unknown (02/13/2023)   Received from [facility]  Stress: No Stress Concern Present (02/24/2023)   Received from [facility]  Tobacco Use: Medium Risk (03/15/2023)   Received from [facility] Literacy: Adequate Health Literacy (03/27/2023)     Distress Screen completed: No     No data to display            Family/Social Information:  Housing Arrangement: Patient currently resides in [place], [place] with her husband of 30 years, and her daughter (age 55). Patient and her family have a total of four pets.  Family members/support persons in your life? Family Transportation concerns: no  Employment: Retired Patient is currently not working and defines it as "retirement". Prior to diagnosis patient had plans of returning to work, however is unable to do so now. Patient has desires to open a business in the near future. Past employment includes Airline pilot, Furniture conservator/restorer.  Income source: Patient's husband's salary supports the household. Financial concerns: No Type of concern: None Food access concerns: no Religious or spiritual practice: Yes-patient would like to be connected to [employer] Services Currently in place:  Family, Hobbies, [employer], English as a second language teacher.  Coping/ Adjustment to diagnosis: Patient understands treatment plan and what happens next? yes Concerns about diagnosis and/or treatment:  Patient reported concerns and fears with  how she will handle the treatment. Patient described hearing stories of those impacted by head and neck cancers, and their experiences.  Patient reported stressors: Anxiety/ nervousness and Adjusting to my illness Hopes and/or priorities: Patient reported her hope is to get better.  Patient enjoys gardening, being outside, time with family/ friends, and traveling Current coping skills/ strengths: Ability for insight , Active sense of humor , Average or above average intelligence , Capable of independent living , Communication skills , Financial means , General fund of knowledge , Motivation for treatment/growth , Religious Affiliation , Special hobby/interest , Supportive family/friends , and Work skills     SUMMARY: Current SDOH Barriers:  No SDOH barriers at this time.  Clinical Social Work Clinical Goal(s):  No clinical social work goals at this time  Interventions: Discussed common feeling and emotions when being diagnosed with cancer, and the importance of support during treatment Informed patient of the support team roles and support services at [facility] Provided CSW contact information and encouraged patient to call with any questions or concerns Provided emotional and social support for patient to discuss treatment. Provided patient with information about support programs, emailed copies of flyers.   Follow Up Plan: CSW will see patient on 9/05 at the [facility] and Neck MDC clinic.  Patient verbalizes understanding of plan: Yes  Marguerita Merles, LCSWA Clinical Social Worker [facility]

## 2023-04-14 ENCOUNTER — Encounter: Payer: Self-pay | Admitting: General Practice

## 2023-04-14 ENCOUNTER — Other Ambulatory Visit: Payer: Self-pay

## 2023-04-14 ENCOUNTER — Ambulatory Visit
Admission: RE | Admit: 2023-04-14 | Discharge: 2023-04-14 | Disposition: A | Discharge status: Home / Self Care | Payer: 59 | Source: Ambulatory Visit | Attending: Radiation Oncology

## 2023-04-14 DIAGNOSIS — Z510 Encounter for antineoplastic radiation therapy: Secondary | ICD-10-CM | POA: Diagnosis not present

## 2023-04-14 LAB — RAD ONC ARIA SESSION SUMMARY
Course Elapsed Days: 3
Plan Fractions Treated to Date: 4
Plan Prescribed Dose Per Fraction: 2 Gy
Plan Total Fractions Prescribed: 30
Plan Total Prescribed Dose: 60 Gy
Reference Point Dosage Given to Date: 8 Gy
Reference Point Session Dosage Given: 2 Gy
Session Number: 4

## 2023-04-14 NOTE — Progress Notes (Signed)
[facility] Spiritual Care Note  Left voicemail of introduction with direct-dial number and encouragement to return call per Kim's request via Nadiyah Abdul/LCSW.   [address] [place], [place], [facility] Pager [phone] Voicemail [phone]

## 2023-04-18 ENCOUNTER — Ambulatory Visit
Admission: RE | Admit: 2023-04-18 | Discharge: 2023-04-18 | Disposition: A | Discharge status: Home / Self Care | Payer: 59 | Source: Ambulatory Visit | Attending: Radiation Oncology | Admitting: Radiation Oncology

## 2023-04-18 ENCOUNTER — Inpatient Hospital Stay: Payer: 59 | Admitting: Dietician

## 2023-04-18 ENCOUNTER — Other Ambulatory Visit: Payer: Self-pay

## 2023-04-18 ENCOUNTER — Other Ambulatory Visit: Payer: Self-pay | Admitting: Radiation Oncology

## 2023-04-18 DIAGNOSIS — Z510 Encounter for antineoplastic radiation therapy: Secondary | ICD-10-CM | POA: Diagnosis present

## 2023-04-18 DIAGNOSIS — F1011 Alcohol abuse, in remission: Secondary | ICD-10-CM | POA: Insufficient documentation

## 2023-04-18 DIAGNOSIS — Z87891 Personal history of nicotine dependence: Secondary | ICD-10-CM | POA: Insufficient documentation

## 2023-04-18 DIAGNOSIS — C021 Malignant neoplasm of border of tongue: Secondary | ICD-10-CM | POA: Diagnosis present

## 2023-04-18 LAB — RAD ONC ARIA SESSION SUMMARY
Course Elapsed Days: 7
Plan Fractions Treated to Date: 5
Plan Prescribed Dose Per Fraction: 2 Gy
Plan Total Fractions Prescribed: 30
Plan Total Prescribed Dose: 60 Gy
Reference Point Dosage Given to Date: 10 Gy
Reference Point Session Dosage Given: 2 Gy
Session Number: 5

## 2023-04-18 MED ORDER — LIDOCAINE VISCOUS HCL 2 % MT SOLN
OROMUCOSAL | 3 refills | Status: AC
Start: 2023-04-18 — End: ?

## 2023-04-18 MED ORDER — SONAFINE EX EMUL
1.0000 | Freq: Two times a day (BID) | CUTANEOUS | Status: DC
  Administered 2023-04-18: 1 via TOPICAL

## 2023-04-18 NOTE — Progress Notes (Signed)
Nutrition Follow Up:  Reached out to patient at preferred home#.  She reports no changes since last week.  She still eating varied foods and textures and working to practice maneuvering foods in her mouth.  No concerns this week with pain or intake.  She reports weighing herself at home to monitor the calibration difference between her scale and radiation.  Nutrition Focused Physical Exam: unable to perform NFPE     Medications: taking liquid MVI     Labs: reviewed 04/11/23     Anthropometrics: reports 4# gain past 2 weeks   Height: 66" Weight:  04/18/23 (144# at home, 2# difference per patient from radiation)  03/28/23  142.4# UBW: 135-145# (did get up to 170# at one point post menopause BMI: 22.98     Estimated Energy Needs   Kcals: 2000-2300 Protein: 80-100 Fluid: 2.5 L     NUTRITION DIAGNOSIS: Inadequate PO intake to meet increased nutrient needs with cancer diagnosis and treatment.   INTERVENTION:    Encouraged adequate nourishment with calorie and protein energy intake with nutrient dense foods when possible to maintain weight/strength and QOL and continued attention to high protein choices. Emailed Recipe for shakes with dairy and without and soft moist protein tip sheet and contact information provided.   MONITORING, EVALUATION, GOAL: weight trends, nutrition impact symptoms, PO intake, labs   Goal weight maintenance (or no more than 2-4#/ month gain)   Next Visit: Remote next week   Gennaro Africa, RDN, LDN Registered Dietitian, [facility] Part Time Remote (Usual office hours: Tuesday-Thursday) Mobile: [phone]

## 2023-04-19 ENCOUNTER — Other Ambulatory Visit: Payer: Self-pay

## 2023-04-19 ENCOUNTER — Ambulatory Visit
Admission: RE | Admit: 2023-04-19 | Discharge: 2023-04-19 | Disposition: A | Discharge status: Home / Self Care | Payer: 59 | Source: Ambulatory Visit | Attending: Radiation Oncology | Admitting: Radiation Oncology

## 2023-04-19 DIAGNOSIS — Z510 Encounter for antineoplastic radiation therapy: Secondary | ICD-10-CM | POA: Diagnosis not present

## 2023-04-19 LAB — RAD ONC ARIA SESSION SUMMARY
Course Elapsed Days: 8
Plan Fractions Treated to Date: 6
Plan Prescribed Dose Per Fraction: 2 Gy
Plan Total Fractions Prescribed: 30
Plan Total Prescribed Dose: 60 Gy
Reference Point Dosage Given to Date: 12 Gy
Reference Point Session Dosage Given: 2 Gy
Session Number: 6

## 2023-04-20 ENCOUNTER — Other Ambulatory Visit: Payer: Self-pay

## 2023-04-20 ENCOUNTER — Inpatient Hospital Stay: Payer: 59

## 2023-04-20 ENCOUNTER — Encounter: Payer: Self-pay | Admitting: Physical Therapy

## 2023-04-20 ENCOUNTER — Ambulatory Visit: Payer: 59 | Attending: Radiation Oncology

## 2023-04-20 ENCOUNTER — Ambulatory Visit
Admission: RE | Admit: 2023-04-20 | Discharge: 2023-04-20 | Disposition: A | Discharge status: Home / Self Care | Payer: 59 | Source: Ambulatory Visit | Attending: Radiation Oncology | Admitting: Radiation Oncology

## 2023-04-20 ENCOUNTER — Ambulatory Visit: Payer: 59 | Attending: Radiation Oncology | Admitting: Physical Therapy

## 2023-04-20 DIAGNOSIS — C021 Malignant neoplasm of border of tongue: Secondary | ICD-10-CM | POA: Insufficient documentation

## 2023-04-20 DIAGNOSIS — R1310 Dysphagia, unspecified: Secondary | ICD-10-CM | POA: Insufficient documentation

## 2023-04-20 DIAGNOSIS — Z510 Encounter for antineoplastic radiation therapy: Secondary | ICD-10-CM | POA: Diagnosis not present

## 2023-04-20 DIAGNOSIS — R471 Dysarthria and anarthria: Secondary | ICD-10-CM | POA: Insufficient documentation

## 2023-04-20 DIAGNOSIS — R293 Abnormal posture: Secondary | ICD-10-CM | POA: Diagnosis not present

## 2023-04-20 LAB — RAD ONC ARIA SESSION SUMMARY
Course Elapsed Days: 9
Plan Fractions Treated to Date: 7
Plan Prescribed Dose Per Fraction: 2 Gy
Plan Total Fractions Prescribed: 30
Plan Total Prescribed Dose: 60 Gy
Reference Point Dosage Given to Date: 14 Gy
Reference Point Session Dosage Given: 2 Gy
Session Number: 7

## 2023-04-20 NOTE — Patient Instructions (Signed)
SWALLOWING EXERCISES Do these until 6 months after your last day of radiation, then 2 times per week afterwards You can use a wet spoon to swallow something, if your mouth gets dry  Effortful Swallows - Press your tongue against the roof of your mouth for 3 seconds, then swallow as hard as you can - Do at least 20 reps/day, in sets of 5-10  Masako Swallow - swallow with your tongue sticking out - Stick tongue out past your lips and gently bite tongue with your teeth - Swallow, while holding your tongue with your teeth - Do at least 20 reps/day, in sets of 5-10   Mendelsohn Maneuver - "squeeze swallow" exercise - Swallow, and squeeze tight to keep your Adam's Apple up - Hold the squeeze for 5-7 seconds - then relax - Do at least 20 reps/day, in sets of 5-10

## 2023-04-20 NOTE — Progress Notes (Signed)
[facility] CSW Progress Note  Clinical Child psychotherapist met with patient to follow up on phone call at the [facility] and Neck Multidisciplinary Clinic. CSW provided card with direct information. CSW showed patient the [employer] lobby and provided direct contact for chaplin L. Lundeen. Patient will follow up with any needs or concerns  Marguerita Merles, LCSWA Clinical Social Worker [facility]

## 2023-04-20 NOTE — Therapy (Signed)
OUTPATIENT SPEECH LANGUAGE PATHOLOGY ONCOLOGY EVALUATION   Patient Name: Meghan Ellis MRN: [id number] DOB:01/08/1966, 57 y.o., female Today's Date: 04/20/2023  PCP: No PCP in Epic REFERRING PROVIDER: Lonie Peak, MD  END OF SESSION:  End of Session - 04/20/23 2029     Visit Number 1    Number of Visits 7    Date for SLP Re-Evaluation 07/19/23    SLP Start Time 0810    SLP Stop Time  0845    SLP Time Calculation (min) 35 min    Activity Tolerance Patient tolerated treatment well             Past Medical History:  Diagnosis Date   Anxiety    History reviewed. No pertinent surgical history. 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 Denies coughing with meals or drinking. Pt accompanied by: self  PERTINENT HISTORY:  She presented to her PCP in March with c/o tongue soreness while eating and a new small white ulceration on her tongue, she was provided several oral rinses for a canker sore without much improvement. 12/30/22 She was referred to Dr. Jeanice Lim at [facility] and underwent biopsies of her ventral tongue which was consistent with SCC, positive for PNI, negative for LVI; tumor present at both lateral margins and focally at the deep margin. She was referred to ENT. 02/08/23 She saw Dr. Moshe Cipro and oral exam at that time revealed an endophytic tumor measuring 28 mm in the ventral anterior left oral tongue (very close to the tip of the tongue) extending into the ventral surface of the tongue. No lymphadenopathy was noted. A laryngoscopy was also performed which was unremarkable.02/21/23 PET demonstrated:  hypermetabolic thickening of the anterior left oral tongue measuring about 2.8 x 2.4 cm with a max SUV of 11.6; associated diffuse thickening of adjacent lip; and an asymmetric focus of increased FDG activity in the left glossotonsillar sulcus with a max SUV of 6.5 without any CT correlate. PET otherwise showed no hypermetabolic cervical lymphadenopathy and no evidence of hypermetabolic distant metastatic disease. 02/24/23 Dr. Moshe Cipro performed a left glossectomy. Pathology from procedure revealed tumor size of 2.0 x 1.5 cm, histology of invasive moderately differentiated SCC with a max depth of invasion approximately 1.4 cm, positive for PNI, negative LVI, and margins negative for carcinoma by at least 3 mm. No lymph nodes were examined. 03/15/23 Consult with Dr. Lurlean Leyden at [place] but then referred to [facility] because she lives locally. 03/28/23 Consult with Dr. Basilio Cairo. She will receive 6 weeks of radiation only. Treatment plan:  She will receive 30 fractions of radiation to her tongue tumor bed and bilateral neck which started on 8/27 and will complete 10/8.  PAIN:  Are you having pain? No  FALLS: Has patient fallen in last 6 months?  No  LIVING ENVIRONMENT: Lives with: lives with their spouse Lives in: House/apartment  PLOF:  Level of assistance: Independent with ADLs, Independent with IADLs Employment: Other: not working  PATIENT GOALS: j  OBJECTIVE:   COGNITION: Overall cognitive status: Within functional limits for tasks assessed  LANGUAGE: Receptive and Expressive language appeared WNL.  ORAL MOTOR EXAMINATION: Overall status: Impaired:  Lingual: Left (ROM, Symmetry, Strength, and Coordination) Comments: SLP appreciates surgical changes to lt anterior lingual structure. Pt was mostly accurate with lingual movement during oral mech assessment but movement was slow and very deliberate.  MOTOR SPEECH: Overall motor speech: impaired Level of impairment: Word Respiration: thoracic  breathing and diaphragmatic/abdominal breathing Phonation: normal Resonance: WFL Articulation: Impaired: word Intelligibility: Intelligible Motor planning: Appears intact Motor speech errors: aware and consistent Interfering components: anatomical limitations Effective technique: slow rate and over articulate - SLP did not trial with pt today due to time constraints. However given her greater accuracy with oral mech tasks when slowing rate SLP believes pt will improve articulation if she overarticulates and slows speech rate.   CLINICAL SWALLOW ASSESSMENT:   Current diet: Dysphagia 3 (mechanical soft) and thin liquids with some regular diet items Objective swallow impairments: difficulty with bolus manipulation Objective recommended compensations: slow down pace of eating, take extra time PRN Dentition:  no bottom dentures Patient directly observed with POs: Yes: dysphagia 3 (soft) and thin liquids  Feeding: able to feed self Liquids provided by: cup Oral phase signs and symptoms: prolonged mastication and prolonged bolus formation Pharyngeal phase signs and symptoms:  none noted  TODAY'S TREATMENT:                                                                                                                                         DATE:  04/20/23 (eval): Research states the risk for dysphagia increases due to radiation and/or chemotherapy treatment due to a variety of factors, so SLP educated the pt about the possibility of reduced/limited ability for PO intake during rad tx. SLP also educated pt regarding possible changes to swallowing musculature after rad tx, and why adherence to dysphagia HEP provided today and PO consumption was necessary to inhibit muscle fibrosis following rad tx and to mitigate muscle disuse atrophy. SLP informed pt why this would be detrimental to their swallowing status and to their pulmonary health. Pt demonstrated understanding of these things to SLP. SLP  encouraged pt to safely eat and drink as deep into their radiation/chemotherapy as possible to provide the best possible long-term swallowing outcome for pt.  SLP then developed an individualized HEP for pt involving oral and pharyngeal strengthening and ROM and pt was instructed how to perform these exercises, including SLP demonstration. After SLP demonstration, pt return demonstrated each exercise. SLP ensured pt performance was correct prior to educating pt on next exercise. Pt required rare min cues faded to modified independent to perform HEP. Pt was instructed to complete this program 6 days/week, at least 20 reps/day in sets of 5-10, BID until 6 months after her last day of rad tx, and then x2 a week after that, indefinitely. Among other modifications for days when pt cannot functionally swallow, SLP told [name] that former patients have told SLP that during their course of radiation therapy, taking prescribed  pain medication just prior to performing HEP (and eating/drinking) has proven helpful in completing HEP (and eating and drinking) more regularly when going through their course of radiation treatment.    PATIENT EDUCATION: Education details: late effects head/neck radiation on swallow function, HEP procedure, and how to cont to perform HEP when difficulty experienced with swallowing during and after radiation course Person educated: Patient Education method: Explanation, Demonstration, Verbal cues, and Handouts Education comprehension: verbalized understanding, returned demonstration, verbal cues required, and needs further education   ASSESSMENT:  CLINICAL IMPRESSION: Patient is a 57 y.o. F who was seen today for assessment of swallowing as they undergo radiation therapy. Today pt ate [place] sandwich and drank thin liquids without overt s/s oral or pharyngeal difficulty. At this time pt swallowing is deemed WNL/WFL with these POs. No oral or overt s/sx pharyngeal deficits, including  aspiration were observed. There are no overt s/s aspiration PNA observed by SLP nor any reported by pt at this time. Data indicate that pt's swallow ability will likely decrease over the course of radiation/chemoradiation therapy and could very well decline over time following the conclusion of that therapy due to muscle disuse atrophy and/or muscle fibrosis. Pt will cont to need to be seen by SLP in order to assess safety of PO intake, assess the need for recommending any objective swallow assessment, and ensuring pt is correctly completing the individualized HEP.  OBJECTIVE IMPAIRMENTS: include dysarthria and dysphagia. These impairments are limiting patient from household responsibilities, ADLs/IADLs, effectively communicating at home and in community, and safety when swallowing. Factors affecting potential to achieve goals and functional outcome are  anatomical changes due to sx . Patient will benefit from skilled SLP services to address above impairments and improve overall function.  REHAB POTENTIAL: Good   GOALS: Goals reviewed with patient? No SHORT TERM GOALS: Target: 3rd total session   1. Pt will compelte HEP with modified independence in 2 sessions Baseline: Goal status: INITIAL   2.  pt will tell SLP why pt is completing HEP with modified independence Baseline:  Goal status: INITIAL   3.  pt will describe 3 overt s/s aspiration PNA with modified independence Baseline:  Goal status: INITIAL   4.  pt will tell SLP how a food journal could hasten return to a more normalized diet Baseline:  Goal status: INITIAL     LONG TERM GOALS: Target: 7th total session   1.  pt will complete HEP with independence over two visits Baseline:  Goal status: INITIAL   2.  pt will describe how to modify HEP over time, and the timeline associated with reduction in HEP frequency with modified independence over two sessions Baseline:  Goal status: INITIAL     PLAN:   SLP FREQUENCY:  once  approx every 4 weeks   SLP DURATION:  7 sessions   PLANNED INTERVENTIONS: Aspiration precaution training, Pharyngeal strengthening exercises, Diet toleration management , Trials of upgraded texture/liquids, SLP instruction and feedback, Compensatory strategies, and Patient/family education   [facility], CCC-SLP 04/20/2023, 8:29 PM

## 2023-04-20 NOTE — Progress Notes (Signed)
Oncology Nurse Navigator Documentation   I met with Meghan Ellis before her scheduled appointments in head and neck MDC today. She is tolerating radiation well so far and knows to call me if she has any questions or concerns.  Hedda Slade RN, BSN, OCN Head & Neck Oncology Nurse Navigator [facility] Phone # [phone]  Fax # [phone]

## 2023-04-20 NOTE — Therapy (Signed)
OUTPATIENT PHYSICAL THERAPY HEAD AND NECK BASELINE EVALUATION   Patient Name: Meghan Ellis MRN: [id number] DOB:1966-01-17, 57 y.o., female Today's Date: 04/20/2023  END OF SESSION:  PT End of Session - 04/20/23 0944     Visit Number 1    Number of Visits 2    Date for PT Re-Evaluation 06/15/23    PT Start Time 0910    PT Stop Time 0938    PT Time Calculation (min) 28 min    Activity Tolerance Patient tolerated treatment well    Behavior During Therapy [facility] for tasks assessed/performed             Past Medical History:  Diagnosis Date   Anxiety    History reviewed. No pertinent surgical history. 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 12/30/22 She was referred to [facility] Jeanice Lim at [facility] and underwent biopsies of her ventral tongue which was consistent with SCC, positive for PNI, negative for LVI; tumor present at both lateral margins and focally at the deep margin. She was referred to ENT. 02/08/23 She saw Dr. Moshe Cipro and oral exam at that time revealed an endophytic tumor measuring 28 mm in the ventral anterior left oral tongue (very close to the tip of the tongue) extending into the  ventral surface of the tongue. No lymphadenopathy was noted. A laryngoscopy was also performed which was unremarkable. 02/21/23 PET demonstrated: hypermetabolic thickening of the anterior left oral tongue measuring about 2.8 x 2.4 cm with a max SUV of 11.6; associated diffuse thickening of adjacent lip; and an asymmetric focus of increased FDG activity in the left glossotonsillar sulcus with a max SUV of 6.5 without any CT correlate. PET otherwise showed no hypermetabolic cervical lymphadenopathy and no evidence of hypermetabolic distant metastatic disease. 02/24/23 Dr. Moshe Cipro performed a left glossectomy. Pathology from procedure revealed tumor size of 2.0 x 1.5 cm, histology of invasive moderately differentiated SCC with a max depth of invasion approximately 1.4 cm, positive for PNI, negative LVI, and margins negative for carcinoma by at least 3 mm. No lymph nodes were examined. She will receive 30 fractions of radiation to her tongue tumor bed and bilateral neck which started on 8/27 and will complete 10/8.  PATIENT GOALS:   to be educated about the signs and symptoms of lymphedema and learn post op HEP.   PAIN:  Are you having pain? No  PRECAUTIONS: Active CA  RED FLAGS: None   WEIGHT BEARING RESTRICTIONS: No  FALLS:  Has patient fallen in last 6 months? No Does the patient have a fear of falling that limits activity? No Is the patient reluctant to leave the house due to a fear of falling?No  LIVING  ENVIRONMENT: Patient lives with: husband, daughter (58 y.o.) Lives in: House/apartment Has following equipment at home: Crutches and None  OCCUPATION: unemployed  LEISURE: travels frequently, walks, gardens, Therapist, music work  PRIOR LEVEL OF FUNCTION: Independent   OBJECTIVE:  COGNITION: Overall cognitive status: Within functional limits for tasks assessed                  POSTURE:  Forward head and rounded shoulders posture  30 SEC SIT TO STAND: 16 reps in 30 sec without use of UEs which  is  Average for patient's age  SHOULDER AROM:   WFL   CERVICAL AROM:   Percent limited  Flexion WFL  Extension WFL  Right lateral flexion 25% limited  Left lateral flexion 25% limited  Right rotation 25% limited  Left rotation 25% limited    (Blank rows=not tested)  GAIT: Assessed: Yes Assistance needed: Independent Ambulation Distance: 10 feet Assistive Device: none Gait pattern: WFL Ambulation surface: Level  PATIENT EDUCATION:  Education details: Neck ROM, importance of posture when sitting, standing and lying down, deep breathing, walking program and importance of staying active throughout treatment, CURE article on staying active, "Why exercise?" flyer, lymphedema and PT info Person educated: Patient Education method: Explanation, Demonstration, Handout Education comprehension: Patient verbalized understanding and returned demonstration  HOME EXERCISE PROGRAM: Patient was instructed today in a home exercise program today for head and neck range of motion exercises. These included active cervical flexion, active cervical extension, active cervical rotation to each direction, upper trap stretch, and shoulder retraction. Patient was encouraged to do these 2-3 times a day, holding for 5 sec each and completing for 5 reps. Pt was educated that once this becomes easier then hold the stretches for 30-60 seconds.    ASSESSMENT:  CLINICAL IMPRESSION: Pt arrives to PT with recently diagnosed tongue cancer. She will receive 30 fractions of radiation to her tongue tumor bed and bilateral neck which started on 8/27 and will complete 10/8. Pt's cervical ROM was 25% limited in bilateral rotation and lateral flexion. Educated pt about signs and symptoms of lymphedema as well as anatomy and physiology of lymphatic system. Educated pt in importance of staying as active as possible throughout treatment to decrease fatigue as well as head and neck ROM exercises to decrease loss of ROM. Will see  pt after completion of radiation to reassess ROM and assess for lymphedema and to determine therapy needs at that time.  Pt will benefit from skilled therapeutic intervention to improve on the following deficits: Decreased knowledge of precautions and postural dysfunction. Other deficits: decreased ROM  PT treatment/interventions: ADL/self-care home management, pt/family education, therapeutic exercise.   REHAB POTENTIAL: Good  CLINICAL DECISION MAKING: Stable/uncomplicated  EVALUATION COMPLEXITY: Low   GOALS: Goals reviewed with patient? YES  LONG TERM GOALS: (STG=LTG)   Name Target Date  Goal status  1 Patient will be able to verbalize understanding of a home exercise program for cervical range of motion, posture, and walking.   Baseline:  No knowledge 04/20/2023 Achieved at eval  2 Patient will be able to verbalize understanding of proper sitting and standing posture. Baseline:  No knowledge 04/20/2023 Achieved at eval  3 Patient will be able to verbalize understanding of lymphedema risk and availability of treatment for this condition Baseline:  No knowledge 04/20/2023 Achieved at eval  4 Pt will demonstrate a return to full cervical ROM and function post operatively compared to baselines and not demonstrate any signs or symptoms of lymphedema.  Baseline: See  objective measurements taken today. 06/15/23 New    PLAN:  PT FREQUENCY/DURATION: EVAL and 1 follow up appointment.   PLAN FOR NEXT SESSION: will reassess 2 weeks after completion of radiation to determine needs.  Patient will follow up at outpatient cancer rehab 2 weeks after completion of radiation.  If the patient requires physical therapy at that time, a specific plan will be dictated and sent to the referring physician for approval. The patient was educated today on appropriate basic range of motion exercises to begin now and continue throughout radiation and educated on the signs and symptoms of lymphedema. Patient  verbalized good understanding.     Physical Therapy Information for During and After Head/Neck Cancer Treatment: Lymphedema is a swelling condition that you may be at risk for in your neck and/or face if you have radiation treatment to the area and/or if you have surgery that includes removing lymph nodes.  There is treatment available for this condition and it is not life-threatening.  Contact your physician or physical therapist with concerns. An excellent resource for those seeking information on lymphedema is the National Lymphedema Network's website.  It can be accessed at www.lymphnet.org If you notice swelling in your neck or face at any time following surgery (even if it is many years from now), please contact your doctor or physical therapist to discuss this.  Lymphedema can be treated at any time but it is easier for you if it is treated early on. If you have had surgery to your neck, please check with your surgeon about how soon to start doing neck range of motion exercises.  If you are not having surgery, I encourage you to start doing neck range of motion exercises today and continue these while undergoing treatment, UNLESS you have irritation of your skin or soft tissue that is aggravated by doing them.  These exercises are intended to help you prevent loss of range of motion and/or to gain range of motion in your neck (which can be limited by tightening effects of radiation), and NOT to aggravate these tissues if they develop sensitivities from treatment. Neck range of motion exercises should be done to the point of feeling a GENTLE, TOLERABLE stretch only.  You are encouraged to start a walking or other exercise program tomorrow and continue this as much as you are able through and after treatment.  Please feel free to call me with any questions. Leonette Most, PT, CLT Physical Therapist and Certified Lymphedema Therapist [facility] [address], [place], [place] [address] [phone] [email or web address]  WALKING  Walking is a great form of exercise to increase your strength, endurance and overall fitness.  A walking program can help you start slowly and gradually build endurance as you go.  Everyone's ability is different, so each person's starting point will be different.  You do not have to follow them exactly.  The are just samples. You should simply find out what's right for you and stick to that program.   In the beginning, you'll start off walking 2-3 times a day for short distances.  As you get stronger, you'll be walking further at just 1-2 times per day.  A. You Can Walk For A Certain Length Of Time Each Day    Walk 5 minutes 3 times per day.  Increase 2 minutes every 2 days (3 times per day).  Work up to 25-30 minutes (1-2 times per day).   Example:   Day 1-2 5  minutes 3 times per day   Day 7-8 12 minutes 2-3 times per day   Day 13-14 25 minutes 1-2 times per day  B. You Can Walk For a Certain Distance Each Day     Distance can be substituted for time.    Example:   3 trips to mailbox (at road)   3 trips to corner of block   3 trips around the block  C. Go to local high school and use the track.    Walk for distance ____ around track  Or time ____ minutes  D. Walk ____ Jog ____ Run ___   Why exercise?  So many benefits! Here are SOME of them: Heart health, including raising your good cholesterol level and reducing heart rate and blood pressure Lung health, including improved lung capacity It burns fats, and most of [place] can stand to be leaner, whether or not we are overweight. It increases the body's natural painkillers and mood elevators, so makes you feel better. Not only makes you feel better, but look better too Improves sleep Takes a bite out of stress May decrease your risk of many types of cancer If you are currently undergoing cancer treatment, exercise may improve your  ability to tolerate treatments including chemotherapy. For everybody, it can improve your energy level. Those with cancer-related fatigue report a 40-50% reduction in this symptom when exercising regularly. If you are a survivor of breast, colon, or prostate cancer, it may decrease your risk of a recurrence. (This may hold for other cancers too, but so far we have data just for these three types.)  How to exercise: Get your doctor's okay. Pick something you enjoy doing, like walking, Zumba, biking, swimming, or whatever. Start at low intensity and time, then gradually increase.  (See walking program handout.) Set a goal to achieve over time.  The American Cancer Society, American Heart Association, and U.S. Dept. of Health and Human Services recommend 150 minutes of moderate exercise, 75 minutes of vigorous exercise, or a combination of both per week. This should be done in episodes at least 10 minutes long, spread throughout the week.  Need help being motivated? Pick something you enjoy doing, because you'll be more inclined to stick with that activity than something that feels like a chore. Do it with a friend so that you are accountable to each other. Schedule it into your day. Place it on your calendar and keep that appointment just like you do any appointment that you make. Join an exercise group that meets at a specific time.  That way, you have to show up on time, and that makes it harder to procrastinate about doing your workout.  It also keeps you accountable--people begin to expect you to be there. Join a gym where you feel comfortable and not intimidated, at the right cost. Sign up for something that you'll need to be in shape for on a specific date, like a 1K or a 5K to walk or run, a 20 or 30 mile bike ride, a mud run or something like that. If the date is looming, you know you'll need to train to be ready for it.  An added benefit is that many of these are fundraisers for good  causes. If you've already paid for a gym membership, group exercise class or event, you might as well work out, so you haven't wasted your money!    [employer], PT 04/20/2023, 9:45 AM

## 2023-04-21 ENCOUNTER — Ambulatory Visit
Admission: RE | Admit: 2023-04-21 | Discharge: 2023-04-21 | Disposition: A | Discharge status: Home / Self Care | Payer: 59 | Source: Ambulatory Visit | Attending: Radiation Oncology

## 2023-04-21 ENCOUNTER — Other Ambulatory Visit: Payer: Self-pay

## 2023-04-21 ENCOUNTER — Encounter: Payer: No Typology Code available for payment source | Admitting: Dietician

## 2023-04-21 DIAGNOSIS — Z510 Encounter for antineoplastic radiation therapy: Secondary | ICD-10-CM | POA: Diagnosis not present

## 2023-04-21 LAB — RAD ONC ARIA SESSION SUMMARY
Course Elapsed Days: 10
Plan Fractions Treated to Date: 8
Plan Prescribed Dose Per Fraction: 2 Gy
Plan Total Fractions Prescribed: 30
Plan Total Prescribed Dose: 60 Gy
Reference Point Dosage Given to Date: 16 Gy
Reference Point Session Dosage Given: 2 Gy
Session Number: 8

## 2023-04-24 ENCOUNTER — Ambulatory Visit
Admission: RE | Admit: 2023-04-24 | Discharge: 2023-04-24 | Disposition: A | Discharge status: Home / Self Care | Payer: 59 | Source: Ambulatory Visit | Attending: Radiation Oncology

## 2023-04-24 ENCOUNTER — Other Ambulatory Visit: Payer: Self-pay

## 2023-04-24 ENCOUNTER — Other Ambulatory Visit: Payer: Self-pay | Admitting: Radiation Oncology

## 2023-04-24 DIAGNOSIS — Z510 Encounter for antineoplastic radiation therapy: Secondary | ICD-10-CM | POA: Diagnosis not present

## 2023-04-24 LAB — RAD ONC ARIA SESSION SUMMARY
Course Elapsed Days: 13
Plan Fractions Treated to Date: 9
Plan Prescribed Dose Per Fraction: 2 Gy
Plan Total Fractions Prescribed: 30
Plan Total Prescribed Dose: 60 Gy
Reference Point Dosage Given to Date: 18 Gy
Reference Point Session Dosage Given: 2 Gy
Session Number: 9

## 2023-04-25 ENCOUNTER — Ambulatory Visit
Admission: RE | Admit: 2023-04-25 | Discharge: 2023-04-25 | Disposition: A | Discharge status: Home / Self Care | Payer: 59 | Source: Ambulatory Visit | Attending: Radiation Oncology

## 2023-04-25 ENCOUNTER — Telehealth: Payer: Self-pay | Admitting: Dietician

## 2023-04-25 ENCOUNTER — Other Ambulatory Visit: Payer: Self-pay

## 2023-04-25 ENCOUNTER — Inpatient Hospital Stay: Payer: 59 | Admitting: Dietician

## 2023-04-25 DIAGNOSIS — Z510 Encounter for antineoplastic radiation therapy: Secondary | ICD-10-CM | POA: Diagnosis not present

## 2023-04-25 LAB — RAD ONC ARIA SESSION SUMMARY
Course Elapsed Days: 14
Plan Fractions Treated to Date: 10
Plan Prescribed Dose Per Fraction: 2 Gy
Plan Total Fractions Prescribed: 30
Plan Total Prescribed Dose: 60 Gy
Reference Point Dosage Given to Date: 20 Gy
Reference Point Session Dosage Given: 2 Gy
Session Number: 10

## 2023-04-25 NOTE — Telephone Encounter (Signed)
See progress note.

## 2023-04-25 NOTE — Progress Notes (Signed)
Nutrition Follow Up:  Reached out to patient at preferred home# [phone].  She reports change in Po choices and textures since last week.  She still eating varied foods and textures ( now cutting off crust on [employer] bread that she uses for sandwiches and moistening with more fluids).  Starting to notice foods are "all tasting the same."  Lack of taste but she's managing to maintain weight.  She thinks she has a small sore in mouth but she has some anxiety about looking at it.  Trusting MD to follow and it hasn't inhibited her intake.  She has recipes emailed last week but has not yet tried any of the recipes.  She is still working with what she has in house.  Nutrition Focused Physical Exam: unable to perform NFPE     Medications: taking liquid MVI     Labs: No new labs     Anthropometrics: Slight increase 3# past month   Height: 66" Weight:  04/24/23  145.6# at radiation 04/18/23 (144# at home, 2# difference per patient from radiation)  03/28/23  142.4# UBW: 135-145# (did get up to 170# at one point post menopause BMI: 22.98     Estimated Energy Needs   Kcals: 2000-2300 Protein: 80-100 Fluid: 2.5 L     NUTRITION DIAGNOSIS: Inadequate PO intake to meet increased nutrient needs with cancer diagnosis and treatment. Improved PO and weight has increased   INTERVENTION:    Encouraged continued attention to nutrient dense foods and ONS to maintain weight.    MONITORING, EVALUATION, GOAL: weight trends, nutrition impact symptoms, PO intake, labs   Goal weight maintenance (or no more than 2-4#/ month gain)   Next Visit: In person with [facility] RD after radiation next week   Gennaro Africa, RDN, LDN Registered Dietitian, [facility] Part Time Remote (Usual office hours: Tuesday-Thursday) Mobile: [phone]

## 2023-04-26 ENCOUNTER — Other Ambulatory Visit: Payer: Self-pay

## 2023-04-26 ENCOUNTER — Ambulatory Visit
Admission: RE | Admit: 2023-04-26 | Discharge: 2023-04-26 | Disposition: A | Discharge status: Home / Self Care | Payer: 59 | Source: Ambulatory Visit | Attending: Radiation Oncology | Admitting: Radiation Oncology

## 2023-04-26 DIAGNOSIS — Z510 Encounter for antineoplastic radiation therapy: Secondary | ICD-10-CM | POA: Diagnosis not present

## 2023-04-26 LAB — RAD ONC ARIA SESSION SUMMARY
Course Elapsed Days: 15
Plan Fractions Treated to Date: 11
Plan Prescribed Dose Per Fraction: 2 Gy
Plan Total Fractions Prescribed: 30
Plan Total Prescribed Dose: 60 Gy
Reference Point Dosage Given to Date: 22 Gy
Reference Point Session Dosage Given: 2 Gy
Session Number: 11

## 2023-04-27 ENCOUNTER — Ambulatory Visit
Admission: RE | Admit: 2023-04-27 | Discharge: 2023-04-27 | Disposition: A | Discharge status: Home / Self Care | Payer: 59 | Source: Ambulatory Visit | Attending: Radiation Oncology | Admitting: Radiation Oncology

## 2023-04-27 ENCOUNTER — Other Ambulatory Visit: Payer: Self-pay

## 2023-04-27 DIAGNOSIS — Z510 Encounter for antineoplastic radiation therapy: Secondary | ICD-10-CM | POA: Diagnosis not present

## 2023-04-27 LAB — RAD ONC ARIA SESSION SUMMARY
Course Elapsed Days: 16
Plan Fractions Treated to Date: 12
Plan Prescribed Dose Per Fraction: 2 Gy
Plan Total Fractions Prescribed: 30
Plan Total Prescribed Dose: 60 Gy
Reference Point Dosage Given to Date: 24 Gy
Reference Point Session Dosage Given: 2 Gy
Session Number: 12

## 2023-04-28 ENCOUNTER — Ambulatory Visit
Admission: RE | Admit: 2023-04-28 | Discharge: 2023-04-28 | Disposition: A | Discharge status: Home / Self Care | Payer: 59 | Source: Ambulatory Visit | Attending: Radiation Oncology

## 2023-04-28 ENCOUNTER — Other Ambulatory Visit: Payer: Self-pay

## 2023-04-28 DIAGNOSIS — Z510 Encounter for antineoplastic radiation therapy: Secondary | ICD-10-CM | POA: Diagnosis not present

## 2023-04-28 LAB — RAD ONC ARIA SESSION SUMMARY
Course Elapsed Days: 17
Plan Fractions Treated to Date: 13
Plan Prescribed Dose Per Fraction: 2 Gy
Plan Total Fractions Prescribed: 30
Plan Total Prescribed Dose: 60 Gy
Reference Point Dosage Given to Date: 26 Gy
Reference Point Session Dosage Given: 2 Gy
Session Number: 13

## 2023-05-01 ENCOUNTER — Other Ambulatory Visit: Payer: Self-pay

## 2023-05-01 ENCOUNTER — Ambulatory Visit
Admission: RE | Admit: 2023-05-01 | Discharge: 2023-05-01 | Disposition: A | Discharge status: Home / Self Care | Payer: 59 | Source: Ambulatory Visit | Attending: Radiation Oncology | Admitting: Radiation Oncology

## 2023-05-01 DIAGNOSIS — Z510 Encounter for antineoplastic radiation therapy: Secondary | ICD-10-CM | POA: Diagnosis not present

## 2023-05-01 LAB — RAD ONC ARIA SESSION SUMMARY
Course Elapsed Days: 20
Plan Fractions Treated to Date: 14
Plan Prescribed Dose Per Fraction: 2 Gy
Plan Total Fractions Prescribed: 30
Plan Total Prescribed Dose: 60 Gy
Reference Point Dosage Given to Date: 28 Gy
Reference Point Session Dosage Given: 2 Gy
Session Number: 14

## 2023-05-02 ENCOUNTER — Inpatient Hospital Stay: Payer: 59 | Admitting: Dietician

## 2023-05-02 ENCOUNTER — Ambulatory Visit
Admission: RE | Admit: 2023-05-02 | Discharge: 2023-05-02 | Disposition: A | Discharge status: Home / Self Care | Payer: 59 | Source: Ambulatory Visit | Attending: Radiation Oncology | Admitting: Radiation Oncology

## 2023-05-02 ENCOUNTER — Other Ambulatory Visit: Payer: Self-pay

## 2023-05-02 DIAGNOSIS — Z510 Encounter for antineoplastic radiation therapy: Secondary | ICD-10-CM | POA: Diagnosis not present

## 2023-05-02 LAB — RAD ONC ARIA SESSION SUMMARY
Course Elapsed Days: 21
Plan Fractions Treated to Date: 15
Plan Prescribed Dose Per Fraction: 2 Gy
Plan Total Fractions Prescribed: 30
Plan Total Prescribed Dose: 60 Gy
Reference Point Dosage Given to Date: 30 Gy
Reference Point Session Dosage Given: 2 Gy
Session Number: 15

## 2023-05-02 NOTE — Progress Notes (Signed)
Nutrition Follow-up:  Patient with SCC of tongue s/p partial glossectomy.  Pt is receiving radiation therapy. She has completed 15/30 planned fractions.  Met with patient following therapy. She reports increased fatigue today which she relates to the weather. Her throat has become tight and sore in the last couple of days. Patient reports mouth sores. She is planning to try manuka honey. Pt doing salt water rinses. Has not tried adding baking soda. Some foods are not tasting good, however reports she is eating well and knows what she needs to do to maintain wt. Pt recalls growing the majority of her own food and purchases meat from a local farmer. Patient recalls smoothies (melons, blueberries, yogurt), lentil soups, scallops, eggs. She does not like the taste of Ensure. She is drinking a protein shake that has ~400 kcal. Pt unsure of protein. Pt denies nausea, vomiting, diarrhea, constipation   Medications: reviewed   Labs: no recent labs  Anthropometrics: Wt 146.2 lb on 9/16 (aria)  9/9 - 145.6 lb    Estimated Energy Needs  Kcals: 2000-2300 Protein: 80-100 Fluid: 2.5 L  NUTRITION DIAGNOSIS: Inadequate oral intake - continues    INTERVENTION:  Continue strategies for increasing calories and protein with small frequent meals/snacks Patient will start using baking soda, salt water gargles - suggested trying before po to aid with altered taste Continue daily protein shake     MONITORING, EVALUATION, GOAL: wt trends, intake   NEXT VISIT: Tuesday September 24 after RT

## 2023-05-03 ENCOUNTER — Other Ambulatory Visit: Payer: Self-pay

## 2023-05-03 ENCOUNTER — Ambulatory Visit
Admission: RE | Admit: 2023-05-03 | Discharge: 2023-05-03 | Disposition: A | Discharge status: Home / Self Care | Payer: 59 | Source: Ambulatory Visit | Attending: Radiation Oncology | Admitting: Radiation Oncology

## 2023-05-03 DIAGNOSIS — Z510 Encounter for antineoplastic radiation therapy: Secondary | ICD-10-CM | POA: Diagnosis not present

## 2023-05-03 LAB — RAD ONC ARIA SESSION SUMMARY
Course Elapsed Days: 22
Plan Fractions Treated to Date: 16
Plan Prescribed Dose Per Fraction: 2 Gy
Plan Total Fractions Prescribed: 30
Plan Total Prescribed Dose: 60 Gy
Reference Point Dosage Given to Date: 32 Gy
Reference Point Session Dosage Given: 2 Gy
Session Number: 16

## 2023-05-04 ENCOUNTER — Ambulatory Visit
Admission: RE | Admit: 2023-05-04 | Discharge: 2023-05-04 | Disposition: A | Discharge status: Home / Self Care | Payer: 59 | Source: Ambulatory Visit | Attending: Radiation Oncology | Admitting: Radiation Oncology

## 2023-05-04 ENCOUNTER — Other Ambulatory Visit: Payer: Self-pay

## 2023-05-04 DIAGNOSIS — Z510 Encounter for antineoplastic radiation therapy: Secondary | ICD-10-CM | POA: Diagnosis not present

## 2023-05-04 LAB — RAD ONC ARIA SESSION SUMMARY
Course Elapsed Days: 23
Plan Fractions Treated to Date: 17
Plan Prescribed Dose Per Fraction: 2 Gy
Plan Total Fractions Prescribed: 30
Plan Total Prescribed Dose: 60 Gy
Reference Point Dosage Given to Date: 34 Gy
Reference Point Session Dosage Given: 2 Gy
Session Number: 17

## 2023-05-05 ENCOUNTER — Other Ambulatory Visit: Payer: Self-pay

## 2023-05-05 ENCOUNTER — Ambulatory Visit
Admission: RE | Admit: 2023-05-05 | Discharge: 2023-05-05 | Disposition: A | Discharge status: Home / Self Care | Payer: 59 | Source: Ambulatory Visit | Attending: Radiation Oncology

## 2023-05-05 DIAGNOSIS — Z510 Encounter for antineoplastic radiation therapy: Secondary | ICD-10-CM | POA: Diagnosis not present

## 2023-05-05 LAB — RAD ONC ARIA SESSION SUMMARY
Course Elapsed Days: 24
Plan Fractions Treated to Date: 18
Plan Prescribed Dose Per Fraction: 2 Gy
Plan Total Fractions Prescribed: 30
Plan Total Prescribed Dose: 60 Gy
Reference Point Dosage Given to Date: 36 Gy
Reference Point Session Dosage Given: 2 Gy
Session Number: 18

## 2023-05-08 ENCOUNTER — Other Ambulatory Visit: Payer: Self-pay

## 2023-05-08 ENCOUNTER — Ambulatory Visit
Admission: RE | Admit: 2023-05-08 | Discharge: 2023-05-08 | Disposition: A | Discharge status: Home / Self Care | Payer: 59 | Source: Ambulatory Visit | Attending: Radiation Oncology | Admitting: Radiation Oncology

## 2023-05-08 ENCOUNTER — Other Ambulatory Visit: Payer: Self-pay | Admitting: Radiation Oncology

## 2023-05-08 ENCOUNTER — Ambulatory Visit
Admission: RE | Admit: 2023-05-08 | Discharge: 2023-05-08 | Disposition: A | Discharge status: Home / Self Care | Payer: 59 | Source: Ambulatory Visit | Attending: Radiation Oncology

## 2023-05-08 DIAGNOSIS — C021 Malignant neoplasm of border of tongue: Secondary | ICD-10-CM

## 2023-05-08 DIAGNOSIS — R5381 Other malaise: Secondary | ICD-10-CM

## 2023-05-08 DIAGNOSIS — Z510 Encounter for antineoplastic radiation therapy: Secondary | ICD-10-CM | POA: Diagnosis not present

## 2023-05-08 LAB — RAD ONC ARIA SESSION SUMMARY
Course Elapsed Days: 27
Plan Fractions Treated to Date: 19
Plan Prescribed Dose Per Fraction: 2 Gy
Plan Total Fractions Prescribed: 30
Plan Total Prescribed Dose: 60 Gy
Reference Point Dosage Given to Date: 38 Gy
Reference Point Session Dosage Given: 2 Gy
Session Number: 19

## 2023-05-08 MED ORDER — ONDANSETRON HCL 8 MG PO TABS
ORAL_TABLET | ORAL | 3 refills | Status: AC
Start: 2023-05-08 — End: ?

## 2023-05-09 ENCOUNTER — Ambulatory Visit
Admission: RE | Admit: 2023-05-09 | Discharge: 2023-05-09 | Disposition: A | Discharge status: Home / Self Care | Payer: 59 | Source: Ambulatory Visit | Attending: Radiation Oncology | Admitting: Radiation Oncology

## 2023-05-09 ENCOUNTER — Other Ambulatory Visit: Payer: Self-pay

## 2023-05-09 ENCOUNTER — Inpatient Hospital Stay: Payer: 59 | Admitting: Dietician

## 2023-05-09 DIAGNOSIS — Z510 Encounter for antineoplastic radiation therapy: Secondary | ICD-10-CM | POA: Diagnosis not present

## 2023-05-09 LAB — RAD ONC ARIA SESSION SUMMARY
Course Elapsed Days: 28
Plan Fractions Treated to Date: 20
Plan Prescribed Dose Per Fraction: 2 Gy
Plan Total Fractions Prescribed: 30
Plan Total Prescribed Dose: 60 Gy
Reference Point Dosage Given to Date: 40 Gy
Reference Point Session Dosage Given: 2 Gy
Session Number: 20

## 2023-05-09 NOTE — Progress Notes (Signed)
Nutrition Follow-up:  Patient with SCC of tongue s/p partial glossectomy. Pt is receiving radiation therapy. She has completed 20/30 planned fractions.   Met with pt in office following radiation. She is tearful at visit. Pt reports she has continued eating/drinking the same amount. She is surprised weights decreased. Recalls 2 cups of soup 3-4/day (lentils, hamburger, beans, garden vegetables), one protein shake (320 kcal, unsure of protein), and a milkshake in the evening. She is drinking white and green tea as water does not taste good. Saliva is thick. Patient currently using biotene. She forgot about trying the baking soda/salt water rinse.   Medications: reviewed   Labs: no new labs  Anthropometrics: Wt 141.2 lb on 9/23 Meghan Ellis) decreased 3% (5lbs) in one week - this is severe  9/16 - 146.2 9/9 - 145.6 lb   Estimated Energy Needs   Kcals: 2000-2300 Protein: 80-100 Fluid: 2.5 L   NUTRITION DIAGNOSIS: Inadequate oral intake - continues   INTERVENTION:  Encouraged high calorie high protein foods - shake recipes provided Provided written estimated energy needs - pt appreciative  Pt will try baking soda salt water rinses  Support and encouragement     MONITORING, EVALUATION, GOAL: wt trends, intake   NEXT VISIT: Tuesday September 30 after RT

## 2023-05-10 ENCOUNTER — Other Ambulatory Visit: Payer: Self-pay

## 2023-05-10 ENCOUNTER — Ambulatory Visit
Admission: RE | Admit: 2023-05-10 | Discharge: 2023-05-10 | Disposition: A | Discharge status: Home / Self Care | Payer: 59 | Source: Ambulatory Visit | Attending: Radiation Oncology

## 2023-05-10 DIAGNOSIS — Z510 Encounter for antineoplastic radiation therapy: Secondary | ICD-10-CM | POA: Diagnosis not present

## 2023-05-10 LAB — RAD ONC ARIA SESSION SUMMARY
Course Elapsed Days: 29
Plan Fractions Treated to Date: 21
Plan Prescribed Dose Per Fraction: 2 Gy
Plan Total Fractions Prescribed: 30
Plan Total Prescribed Dose: 60 Gy
Reference Point Dosage Given to Date: 42 Gy
Reference Point Session Dosage Given: 2 Gy
Session Number: 21

## 2023-05-11 ENCOUNTER — Ambulatory Visit
Admission: RE | Admit: 2023-05-11 | Discharge: 2023-05-11 | Disposition: A | Discharge status: Home / Self Care | Payer: 59 | Source: Ambulatory Visit | Attending: Radiation Oncology | Admitting: Radiation Oncology

## 2023-05-11 ENCOUNTER — Other Ambulatory Visit: Payer: Self-pay

## 2023-05-11 DIAGNOSIS — Z510 Encounter for antineoplastic radiation therapy: Secondary | ICD-10-CM | POA: Diagnosis not present

## 2023-05-11 LAB — RAD ONC ARIA SESSION SUMMARY
Course Elapsed Days: 30
Plan Fractions Treated to Date: 22
Plan Prescribed Dose Per Fraction: 2 Gy
Plan Total Fractions Prescribed: 30
Plan Total Prescribed Dose: 60 Gy
Reference Point Dosage Given to Date: 44 Gy
Reference Point Session Dosage Given: 2 Gy
Session Number: 22

## 2023-05-12 ENCOUNTER — Other Ambulatory Visit: Payer: Self-pay

## 2023-05-12 ENCOUNTER — Ambulatory Visit
Admission: RE | Admit: 2023-05-12 | Discharge: 2023-05-12 | Disposition: A | Discharge status: Home / Self Care | Payer: 59 | Source: Ambulatory Visit | Attending: Radiation Oncology | Admitting: Radiation Oncology

## 2023-05-12 DIAGNOSIS — Z510 Encounter for antineoplastic radiation therapy: Secondary | ICD-10-CM | POA: Diagnosis not present

## 2023-05-12 LAB — RAD ONC ARIA SESSION SUMMARY
Course Elapsed Days: 31
Plan Fractions Treated to Date: 23
Plan Prescribed Dose Per Fraction: 2 Gy
Plan Total Fractions Prescribed: 30
Plan Total Prescribed Dose: 60 Gy
Reference Point Dosage Given to Date: 46 Gy
Reference Point Session Dosage Given: 2 Gy
Session Number: 23

## 2023-05-15 ENCOUNTER — Other Ambulatory Visit: Payer: Self-pay

## 2023-05-15 ENCOUNTER — Ambulatory Visit
Admission: RE | Admit: 2023-05-15 | Discharge: 2023-05-15 | Disposition: A | Discharge status: Home / Self Care | Payer: 59 | Source: Ambulatory Visit | Attending: Radiation Oncology | Admitting: Radiation Oncology

## 2023-05-15 ENCOUNTER — Other Ambulatory Visit: Payer: Self-pay | Admitting: Radiation Oncology

## 2023-05-15 ENCOUNTER — Ambulatory Visit
Admission: RE | Admit: 2023-05-15 | Discharge: 2023-05-15 | Disposition: A | Discharge status: Home / Self Care | Payer: 59 | Source: Ambulatory Visit | Attending: Radiation Oncology

## 2023-05-15 DIAGNOSIS — C021 Malignant neoplasm of border of tongue: Secondary | ICD-10-CM

## 2023-05-15 DIAGNOSIS — Z510 Encounter for antineoplastic radiation therapy: Secondary | ICD-10-CM | POA: Diagnosis not present

## 2023-05-15 LAB — RAD ONC ARIA SESSION SUMMARY
Course Elapsed Days: 34
Plan Fractions Treated to Date: 24
Plan Prescribed Dose Per Fraction: 2 Gy
Plan Total Fractions Prescribed: 30
Plan Total Prescribed Dose: 60 Gy
Reference Point Dosage Given to Date: 48 Gy
Reference Point Session Dosage Given: 2 Gy
Session Number: 24

## 2023-05-15 MED ORDER — GABAPENTIN 300 MG/6ML PO SOLN
300.0000 mg | Freq: Three times a day (TID) | ORAL | 0 refills | Status: AC
Start: 2023-05-15 — End: ?

## 2023-05-15 MED ORDER — FLUCONAZOLE 10 MG/ML PO SUSR
ORAL | 0 refills | Status: AC
Start: 2023-05-15 — End: ?

## 2023-05-16 ENCOUNTER — Inpatient Hospital Stay: Payer: 59 | Admitting: Dietician

## 2023-05-16 ENCOUNTER — Ambulatory Visit
Admission: RE | Admit: 2023-05-16 | Discharge: 2023-05-16 | Disposition: A | Discharge status: Home / Self Care | Payer: 59 | Source: Ambulatory Visit | Attending: Radiation Oncology | Admitting: Radiation Oncology

## 2023-05-16 ENCOUNTER — Other Ambulatory Visit: Payer: Self-pay

## 2023-05-16 DIAGNOSIS — Z87891 Personal history of nicotine dependence: Secondary | ICD-10-CM | POA: Diagnosis not present

## 2023-05-16 DIAGNOSIS — C021 Malignant neoplasm of border of tongue: Secondary | ICD-10-CM | POA: Diagnosis present

## 2023-05-16 DIAGNOSIS — Z510 Encounter for antineoplastic radiation therapy: Secondary | ICD-10-CM | POA: Diagnosis present

## 2023-05-16 LAB — RAD ONC ARIA SESSION SUMMARY
Course Elapsed Days: 35
Plan Fractions Treated to Date: 25
Plan Prescribed Dose Per Fraction: 2 Gy
Plan Total Fractions Prescribed: 30
Plan Total Prescribed Dose: 60 Gy
Reference Point Dosage Given to Date: 50 Gy
Reference Point Session Dosage Given: 2 Gy
Session Number: 25

## 2023-05-16 NOTE — Progress Notes (Signed)
Nutrition Follow-up:  Patient with SCC of tongue s/p partial glossectomy. Pt is receiving radiation therapy. She has completed 25/30 planned fractions.   Met with patient after RT. Patient reports soreness on tongue and gums that started on Friday. Noted mucositis vs thrush per Dr. Basilio Cairo on 9/30. Patient is awaiting pharmacy to fill liquid diflucan. Patient reports she "so tired." She did not eat yesterday due to pain and fatigue. Patient reports focusing on hydration and drank 2 bottles of water. Patient has been working to meet energy needs over the last week. This has been challenging. Patient has found high calorie/high protein smoothie recipes on-line that she preferred over shake recipes provided by RD. She has been drinking a couple shakes (~700 kcal, 44 g each) as well as high calorie soups.   Medications: diflucan, gabapentin  Labs: no new labs  Anthropometrics: Wt 145.4 lb on 9/30 - increased  9/23 - 141.2 lb  9/16 - 146.2 lb 9/9 - 145.6 lb   Estimated Energy Needs   Kcals: 2000-2300 Protein: 80-100 Fluid: 2.5 L   NUTRITION DIAGNOSIS: Inadequate oral intake - continues     INTERVENTION:  Continue high calorie high protein shakes/smoothies, suggested drinking smaller portions more often for ease of intake Pt will try lidocaine rinse 20-30 minutes prior to intake as needed Support and encouragement    MONITORING, EVALUATION, GOAL: wt trends, intake   NEXT VISIT: Tuesday October 8 after final RT

## 2023-05-17 ENCOUNTER — Other Ambulatory Visit: Payer: Self-pay

## 2023-05-17 ENCOUNTER — Ambulatory Visit
Admission: RE | Admit: 2023-05-17 | Discharge: 2023-05-17 | Disposition: A | Discharge status: Home / Self Care | Payer: 59 | Source: Ambulatory Visit | Attending: Radiation Oncology | Admitting: Radiation Oncology

## 2023-05-17 DIAGNOSIS — Z510 Encounter for antineoplastic radiation therapy: Secondary | ICD-10-CM | POA: Diagnosis not present

## 2023-05-17 LAB — RAD ONC ARIA SESSION SUMMARY
Course Elapsed Days: 36
Plan Fractions Treated to Date: 26
Plan Prescribed Dose Per Fraction: 2 Gy
Plan Total Fractions Prescribed: 30
Plan Total Prescribed Dose: 60 Gy
Reference Point Dosage Given to Date: 52 Gy
Reference Point Session Dosage Given: 2 Gy
Session Number: 26

## 2023-05-18 ENCOUNTER — Ambulatory Visit: Payer: 59 | Attending: Radiation Oncology

## 2023-05-18 ENCOUNTER — Other Ambulatory Visit: Payer: Self-pay

## 2023-05-18 ENCOUNTER — Ambulatory Visit
Admission: RE | Admit: 2023-05-18 | Discharge: 2023-05-18 | Disposition: A | Discharge status: Home / Self Care | Payer: 59 | Source: Ambulatory Visit | Attending: Radiation Oncology | Admitting: Radiation Oncology

## 2023-05-18 DIAGNOSIS — R1310 Dysphagia, unspecified: Secondary | ICD-10-CM | POA: Diagnosis present

## 2023-05-18 DIAGNOSIS — R471 Dysarthria and anarthria: Secondary | ICD-10-CM | POA: Diagnosis present

## 2023-05-18 DIAGNOSIS — Z510 Encounter for antineoplastic radiation therapy: Secondary | ICD-10-CM | POA: Diagnosis not present

## 2023-05-18 LAB — RAD ONC ARIA SESSION SUMMARY
Course Elapsed Days: 37
Plan Fractions Treated to Date: 27
Plan Prescribed Dose Per Fraction: 2 Gy
Plan Total Fractions Prescribed: 30
Plan Total Prescribed Dose: 60 Gy
Reference Point Dosage Given to Date: 54 Gy
Reference Point Session Dosage Given: 2 Gy
Session Number: 27

## 2023-05-18 NOTE — Therapy (Signed)
OUTPATIENT SPEECH LANGUAGE PATHOLOGY ONCOLOGY EVALUATION   Patient Name: Meghan Ellis MRN: [id number] DOB:1966/05/29, 57 y.o., female Today's Date: 05/18/2023  PCP: No PCP in Epic REFERRING PROVIDER: Lonie Peak, MD  END OF SESSION:  End of Session - 05/18/23 0942     Visit Number 2    Number of Visits 7    Date for SLP Re-Evaluation 07/19/23    SLP Start Time 0905    SLP Stop Time  0937    SLP Time Calculation (min) 32 min    Activity Tolerance Patient tolerated treatment well             Past Medical History:  Diagnosis Date   Anxiety    History reviewed. No pertinent surgical history. 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 Pt accompanied by: self  PERTINENT HISTORY:  She presented to her PCP in March with c/o tongue soreness while eating and a new small white ulceration on her tongue, she was provided several oral rinses for a canker sore without much improvement. 12/30/22 She was referred to Dr. Jeanice Ellis at [facility] and underwent biopsies of her ventral tongue which was consistent with SCC, positive for PNI, negative for LVI; tumor present at both lateral margins and focally at the deep margin. She was referred to ENT. 02/08/23 She saw Dr. Moshe Ellis and oral exam at that time revealed an endophytic tumor measuring 28 mm in the ventral anterior left oral tongue (very close to the tip of the tongue) extending into the ventral surface of the tongue. No lymphadenopathy was noted. A laryngoscopy was also performed which was unremarkable.02/21/23 PET demonstrated: hypermetabolic thickening of the  anterior left oral tongue measuring about 2.8 x 2.4 cm with a max SUV of 11.6; associated diffuse thickening of adjacent lip; and an asymmetric focus of increased FDG activity in the left glossotonsillar sulcus with a max SUV of 6.5 without any CT correlate. PET otherwise showed no hypermetabolic cervical lymphadenopathy and no evidence of hypermetabolic distant metastatic disease. 02/24/23 Dr. Moshe Ellis performed a left glossectomy. Pathology from procedure revealed tumor size of 2.0 x 1.5 cm, histology of invasive moderately differentiated SCC with a max depth of invasion approximately 1.4 cm, positive for PNI, negative LVI, and margins negative for carcinoma by at least 3 mm. No lymph nodes were examined. 03/15/23 Consult with Dr. Lurlean Ellis at [place] but then referred to [facility] because she lives locally. 03/28/23 Consult with Dr. Basilio Ellis. She will receive 6 weeks of radiation only. Treatment plan:  She will receive 30 fractions of radiation to her tongue tumor bed and bilateral neck which started on 8/27 and will complete 10/8.  PAIN:  Are you having pain? Yes: NPRS scale: 6/10 Pain location: tongue and gums Pain description: sore Aggravating factors: POs Relieving factors: lidocaine  FALLS: Has patient fallen in last 6 months?  No   PATIENT GOALS: maintain WNL/WFL swallowing  OBJECTIVE:   TODAY'S TREATMENT:  DATE:  05/18/23: Pt was seen today to assess accuracy with HEP and to observe with POs. She has not completed HEP in approx one week. SLP strongly encouraged her to incr reps as she is able, even one-two here and there each day until she can increase more. SLP demonstrated each exercise for pt and she was able to complete one rep of [place] independently.  She is only drinking at this time, and drank water today without overt s/sx pharygneal deficits. She placed a  straw on the rt side of her lips/oral cavity to minimize pain and to encourage less oral residue.  04/20/23 (eval): Research states the risk for dysphagia increases due to radiation and/or chemotherapy treatment due to a variety of factors, so SLP educated the pt about the possibility of reduced/limited ability for PO intake during rad tx. SLP also educated pt regarding possible changes to swallowing musculature after rad tx, and why adherence to dysphagia HEP provided today and PO consumption was necessary to inhibit muscle fibrosis following rad tx and to mitigate muscle disuse atrophy. SLP informed pt why this would be detrimental to their swallowing status and to their pulmonary health. Pt demonstrated understanding of these things to SLP. SLP encouraged pt to safely eat and drink as deep into their radiation/chemotherapy as possible to provide the best possible long-term swallowing outcome for pt.  SLP then developed an individualized HEP for pt involving oral and pharyngeal strengthening and ROM and pt was instructed how to perform these exercises, including SLP demonstration. After SLP demonstration, pt return demonstrated each exercise. SLP ensured pt performance was correct prior to educating pt on next exercise. Pt required rare min cues faded to modified independent to perform HEP. Pt was instructed to complete this program 6 days/week, at least 20 reps/day in sets of 5-10, BID until 6 months after her last day of rad tx, and then x2 a week after that, indefinitely. Among other modifications for days when pt cannot functionally swallow, SLP told Meghan Ellis that former patients have told SLP that during their course of radiation therapy, taking prescribed pain medication just prior to performing HEP (and eating/drinking) has proven helpful in completing HEP (and eating and drinking) more regularly when going through their course of radiation treatment.    PATIENT EDUCATION: Education details: lsee "today's  treatment" Person educated: Patient Education method: Explanation and Demonstration Education comprehension: verbalized understanding, returned demonstration, and needs further education   ASSESSMENT:  CLINICAL IMPRESSION: Patient is a 57 y.o. F who was seen today for treatment of swallowing as they undergo radiation therapy. Today pt drank thin liquids with a straw on the rt side of the oral cavity, without overt s/s pharyngeal difficulty. Oral stage was functional. At this time pt swallowing is deemed WNL/WFL with these POs. There are no overt s/s aspiration PNA observed by SLP nor any reported by pt at this time. Data indicate that pt's swallow ability will likely decrease over the course of radiation/chemoradiation therapy and could very well decline over time following the conclusion of that therapy due to muscle disuse atrophy and/or muscle fibrosis. Pt will cont to need to be seen by SLP in order to assess safety of PO intake, assess the need for recommending any objective swallow assessment, and ensuring pt is correctly completing the individualized HEP.  OBJECTIVE IMPAIRMENTS: include dysarthria and dysphagia. These impairments are limiting patient from household responsibilities, ADLs/IADLs, effectively communicating at home and in community, and safety when swallowing. Factors affecting potential to achieve goals  and functional outcome are  anatomical changes due to sx . Patient will benefit from skilled SLP services to address above impairments and improve overall function.  REHAB POTENTIAL: Good   GOALS: Goals reviewed with patient? No SHORT TERM GOALS: Target: 3rd total session   1. Pt will compelte HEP with modified independence in 2 sessions Baseline: Goal status: INITIAL   2.  pt will tell SLP why pt is completing HEP with modified independence Baseline:  Goal status: INITIAL   3.  pt will describe 3 overt s/s aspiration PNA with modified independence Baseline:  Goal  status: INITIAL   4.  pt will tell SLP how a food journal could hasten return to a more normalized diet Baseline:  Goal status: INITIAL     LONG TERM GOALS: Target: 7th total session   1.  pt will complete HEP with independence over two visits Baseline:  Goal status: INITIAL   2.  pt will describe how to modify HEP over time, and the timeline associated with reduction in HEP frequency with modified independence over two sessions Baseline:  Goal status: INITIAL     PLAN:   SLP FREQUENCY:  once approx every 4 weeks   SLP DURATION:  7 sessions   PLANNED INTERVENTIONS: Aspiration precaution training, Pharyngeal strengthening exercises, Diet toleration management , Trials of upgraded texture/liquids, SLP instruction and feedback, Compensatory strategies, and Patient/family education   [facility], CCC-SLP 05/18/2023, 9:43 AM

## 2023-05-19 ENCOUNTER — Ambulatory Visit
Admission: RE | Admit: 2023-05-19 | Discharge: 2023-05-19 | Disposition: A | Discharge status: Home / Self Care | Payer: 59 | Source: Ambulatory Visit | Attending: Radiation Oncology | Admitting: Radiation Oncology

## 2023-05-19 ENCOUNTER — Other Ambulatory Visit: Payer: Self-pay

## 2023-05-19 DIAGNOSIS — Z510 Encounter for antineoplastic radiation therapy: Secondary | ICD-10-CM | POA: Diagnosis not present

## 2023-05-19 DIAGNOSIS — C021 Malignant neoplasm of border of tongue: Secondary | ICD-10-CM

## 2023-05-19 LAB — RAD ONC ARIA SESSION SUMMARY
Course Elapsed Days: 38
Plan Fractions Treated to Date: 28
Plan Prescribed Dose Per Fraction: 2 Gy
Plan Total Fractions Prescribed: 30
Plan Total Prescribed Dose: 60 Gy
Reference Point Dosage Given to Date: 56 Gy
Reference Point Session Dosage Given: 2 Gy
Session Number: 28

## 2023-05-19 MED ORDER — SONAFINE EX EMUL
1.0000 | Freq: Once | CUTANEOUS | Status: AC
  Administered 2023-05-19: 1 via TOPICAL

## 2023-05-22 ENCOUNTER — Ambulatory Visit
Admission: RE | Admit: 2023-05-22 | Discharge: 2023-05-22 | Disposition: A | Discharge status: Home / Self Care | Payer: 59 | Source: Ambulatory Visit | Attending: Radiation Oncology | Admitting: Radiation Oncology

## 2023-05-22 ENCOUNTER — Other Ambulatory Visit: Payer: Self-pay

## 2023-05-22 ENCOUNTER — Ambulatory Visit: Payer: No Typology Code available for payment source

## 2023-05-22 DIAGNOSIS — Z510 Encounter for antineoplastic radiation therapy: Secondary | ICD-10-CM | POA: Diagnosis not present

## 2023-05-22 LAB — RAD ONC ARIA SESSION SUMMARY
Course Elapsed Days: 41
Plan Fractions Treated to Date: 29
Plan Prescribed Dose Per Fraction: 2 Gy
Plan Total Fractions Prescribed: 30
Plan Total Prescribed Dose: 60 Gy
Reference Point Dosage Given to Date: 58 Gy
Reference Point Session Dosage Given: 2 Gy
Session Number: 29

## 2023-05-23 ENCOUNTER — Ambulatory Visit
Admission: RE | Admit: 2023-05-23 | Discharge: 2023-05-23 | Disposition: A | Discharge status: Home / Self Care | Payer: 59 | Source: Ambulatory Visit | Attending: Radiation Oncology

## 2023-05-23 ENCOUNTER — Other Ambulatory Visit: Payer: Self-pay

## 2023-05-23 ENCOUNTER — Inpatient Hospital Stay: Payer: 59 | Admitting: Dietician

## 2023-05-23 DIAGNOSIS — Z510 Encounter for antineoplastic radiation therapy: Secondary | ICD-10-CM | POA: Diagnosis not present

## 2023-05-23 LAB — RAD ONC ARIA SESSION SUMMARY
Course Elapsed Days: 42
Plan Fractions Treated to Date: 30
Plan Prescribed Dose Per Fraction: 2 Gy
Plan Total Fractions Prescribed: 30
Plan Total Prescribed Dose: 60 Gy
Reference Point Dosage Given to Date: 60 Gy
Reference Point Session Dosage Given: 2 Gy
Session Number: 30

## 2023-05-23 NOTE — Progress Notes (Signed)
Nutrition Follow-up:  Patient with SCC of tongue s/p partial glossectomy. Pt completed 30/30 fractions 10/8.   Met with pt following final radiation therapy treatment. Patient reports feeling tired, however states that treatment was not as bad as expected. She is glad to be finished. Patient asking when her taste will come back. Patient is drinking high calorie high protein smoothies, creamy of mushroom soup, tamale, refried beans. She has increased her intake of water.    Medications: reviewed  Labs: no new labs   Anthropometrics: Last wt 146.2 lb on 10/4 - increased   9/30 - 145.4 lb  9/23 - 141.2 lb  9/16 - 146.2 lb 9/9 - 145.6 lb   Estimated Energy Needs   Kcals: 2000-2300 Protein: 80-100 Fluid: 2.5 L   NUTRITION DIAGNOSIS: Inadequate oral intake - stable   INTERVENTION:  Continue high calorie/high protein shakes for wt maintenance      MONITORING, EVALUATION, GOAL: wt trends, intake   NEXT VISIT: Friday October 25 after MD

## 2023-05-23 NOTE — Progress Notes (Signed)
Oncology Nurse Navigator Documentation   Met with Meghan Ellis after final RT to offer support and to celebrate end of radiation treatment.   Provided verbal post-RT guidance: Importance of keeping all follow-up appts, especially those with Nutrition and SLP. Importance of protecting treatment area from sun. Continuation of Sonafine application 2-3 times daily, application of antibiotic ointment to areas of raw skin; when supply of Sonafine exhausted transition to OTC lotion with vitamin E.  Explained my role as navigator will continue for several more months, encouraged him to call me with needs/concerns.    Hedda Slade RN, BSN, OCN Head & Neck Oncology Nurse Navigator [facility] at [facility] Phone # [phone]  Fax # [phone]

## 2023-05-24 NOTE — Radiation Completion Notes (Signed)
Patient Name: Meghan Ellis, Meghan Ellis MRN: [id number] Date of Birth: [birth year] Referring Physician: Lupita Shutter, M.D. Date of Service: 2023-05-24 Radiation Oncologist: Lonie Peak, M.D. 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) T=pT3, N=pN0, M=cM0 Intent: Curative     ==========DELIVERED PLANS==========  First Treatment Date: 2023-04-11 - Last Treatment Date: 2023-05-23   Plan Name: HN_Tongue Site: Tongue Technique: IMRT Mode: Photon Dose Per Fraction: 2 Gy Prescribed Dose (Delivered / Prescribed): 60 Gy / 60 Gy Prescribed Fxs (Delivered / Prescribed): 30 / 30     ==========ON TREATMENT VISIT DATES========== 2023-04-18, 2023-04-24, 2023-05-01, 2023-05-08, 2023-05-15, 2023-05-19     ==========UPCOMING VISITS==========       ==========APPENDIX - ON TREATMENT VISIT NOTES==========   See weekly On Treatment Notes in Epic for details.

## 2023-05-26 NOTE — Progress Notes (Signed)
Ms. Meghan Ellis presents for follow up today after completion of radiation for tongue cancer on 05-23-23.   Pain issues, if any: pt does have mouth sores Using a feeding tube?: never had feeding tube Weight changes, if any:  Wt Readings from Last 3 Encounters:  06/09/23 143 lb 9.6 oz (65.1 kg)  03/28/23 142 lb 6.4 oz (64.6 kg)  Pt using a protein supplement.   Swallowing issues, if any: throat is tight in the morning but improves during the day Smoking or chewing tobacco? Does not use Using fluoride toothpaste daily? Does not use Last ENT visit was on: none since diagnosis Other notable issues, if any: Pt taste continues to be altered. Pt reports tongue taste scalded. No major cancer or concerns.   Vitals:   06/09/23 1036  BP: 116/72  Pulse: 89  Resp: 18  Temp: (!) 97.4 F (36.3 C)  SpO2: 100%

## 2023-06-06 ENCOUNTER — Ambulatory Visit: Payer: Self-pay | Admitting: Physical Therapy

## 2023-06-08 ENCOUNTER — Encounter: Payer: Self-pay | Admitting: Physical Therapy

## 2023-06-08 ENCOUNTER — Ambulatory Visit: Payer: 59 | Attending: Radiation Oncology | Admitting: Physical Therapy

## 2023-06-08 DIAGNOSIS — R293 Abnormal posture: Secondary | ICD-10-CM | POA: Diagnosis present

## 2023-06-08 DIAGNOSIS — C021 Malignant neoplasm of border of tongue: Secondary | ICD-10-CM | POA: Diagnosis present

## 2023-06-08 NOTE — Therapy (Signed)
OUTPATIENT PHYSICAL THERAPY HEAD AND NECK FOLLOW UP   Patient Name: Meghan Ellis MRN: [id number] DOB:1966/05/29, 57 y.o., female Today's Date: 06/08/2023  END OF SESSION:  PT End of Session - 06/08/23 1446     Visit Number 2    Number of Visits 2    Date for PT Re-Evaluation 06/15/23    PT Start Time 1402    PT Stop Time 1435    PT Time Calculation (min) 33 min    Activity Tolerance Patient tolerated treatment well    Behavior During Therapy [facility] for tasks assessed/performed             Past Medical History:  Diagnosis Date   Anxiety    History reviewed. No pertinent surgical history. 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 I had sores in my mouth. I lost some weight. Now - as of Sunday and Monday I am eating fish. I can eat more solid food. The front of the tongue feels very weird and my tongue tastes scalded. I feel like I have some puffiness in my cheeks.   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 12/30/22 She was referred to Dr. Jeanice Lim at [facility] and underwent biopsies of her ventral tongue which  was consistent with SCC, positive for PNI, negative for LVI; tumor present at both lateral margins and focally at the deep margin. She was referred to ENT. 02/08/23 She saw Dr. Moshe Cipro and oral exam at that time revealed an endophytic tumor measuring 28 mm in the ventral anterior left oral tongue (very close to the tip of the tongue) extending into the ventral surface of the tongue. No lymphadenopathy was noted. A laryngoscopy was also performed which was  unremarkable. 02/21/23 PET demonstrated: hypermetabolic thickening of the anterior left oral tongue measuring about 2.8 x 2.4 cm with a max SUV of 11.6; associated diffuse thickening of adjacent lip; and an asymmetric focus of increased FDG activity in the left glossotonsillar sulcus with a max SUV of 6.5 without any CT correlate. PET otherwise showed no hypermetabolic cervical lymphadenopathy and no evidence of hypermetabolic distant metastatic disease. 02/24/23 Dr. Moshe Cipro performed a left glossectomy. Pathology from procedure revealed tumor size of 2.0 x 1.5 cm, histology of invasive moderately differentiated SCC with a max depth of invasion approximately 1.4 cm, positive for PNI, negative LVI, and margins negative for carcinoma by at least 3 mm. No lymph nodes were examined. She will receive 30 fractions of radiation to her tongue tumor bed and bilateral neck which started on 8/27 and will complete 10/8.    PATIENT GOALS:  Reassess how my recovery is going related to arm function, pain, and swelling.  PAIN:  Are you having pain? No annoying sensations in mouth  PRECAUTIONS: Recent Surgery, radiation, lymphedema risk  RED FLAGS: None   ACTIVITY LEVEL / LEISURE: pt is very active, she walks and hikes and gardens   OBJECTIVE:   POSTURE:  Forward head and rounded shoulders posture   30 SEC SIT TO STAND: 06/08/23: 27 reps in 30 sec without use of UEs which is excellent for pt's age  At eval 04/20/23: 16 reps in 30 sec without use of UEs which is   Average for patient's age   SHOULDER AROM:   WFL     CERVICAL AROM: pt reports decreased neck ROM due to degenerative disc     Percent limited 06/08/23  Flexion WFL WFL  Extension [facility] WFL  Right lateral flexion 25% limited 25% limited  Left lateral flexion 25% limited 25% limited  Right rotation 25% limited WFL  Left rotation 25% limited 25% limited                          (Blank rows=not tested)  LYMPHEDEMA ASSESSMENT:    Circumference in cm  4 cm superior to sternal notch around neck 34.1  6 cm superior to sternal notch around neck 32.9  8 cm superior to sternal notch around neck 32.6  R lateral nostril from base of nose to medial tragus 10.7  L lateral nostril from base of nose to medial tragus 10.5  R corner of mouth to where ear lobe meets face 9  L corner of mouth to where ear lobe meets face 9.5  10 cm superior to sternal notch around neck 32.8     (Blank rows=not tested)  Other symptoms:  Heaviness/tightness No only has tightness in her neck related to disc issue in which pt reports she will eventually require surgery Pain No Pitting edema No Infections No Decreased scar mobility No Stemmer sign No  PATIENT EDUCATION:  Education details: posture, signs of lymphedema, HEP  Person educated: Patient Education method: Explanation Education comprehension: verbalized understanding  HOME EXERCISE PROGRAM: Reviewed previously given post op HEP.   ASSESSMENT:  CLINICAL IMPRESSION: Pt returns to PT after completing radiation for treatment of tongue cancer. She has returned to baseline neck ROM and does not demonstrate any signs of lymphedema. Educated pt in signs and symptoms of lymphedema and encouraged her to return if she begins to notice any swelling. Took baseline circumference measurements of her neck today. Pt will be discharged at this time.   Pt will  benefit from skilled therapeutic intervention to improve on the following deficits: decreased knowledge of  condition  PT treatment/interventions: ADL/Self care home management, 97110-Therapeutic exercises, 97535- Self Care, and Patient/Family education    GOALS: Goals reviewed with patient? Yes  LONG TERM GOALS:  (STG=LTG) GOALS Name Target Date  Goal status  1 Pt will demonstrate a return to baseline cervical ROM measurements and not demonstrate any signs or symptoms of lymphedema. Baseline: 06/08/23  MET  2     3     4           PLAN:  PT FREQUENCY/DURATION: d/c this visit  PLAN FOR NEXT SESSION: d/c this visit   [facility]  [address], [address]  [place] [address]  [phone]   Home exercise Program Continue doing the exercises you were given until you feel like you can do them without feeling any tightness at the end. It is best to do them for several months after completion of radiation since the effects of radiation continue past completion.   Walking Program Studies show that 30 minutes of walking per day (fast enough to elevate your heart rate) can significantly reduce the risk of a cancer recurrence. If you can't walk due to other medical reasons, we encourage you to find another activity you could do (like a stationary bike or water exercise).  Posture After treatment for head and neck cancer, people frequently sit with rounded shoulders and forward head posture because the front of the neck has become tight and it feels better. If you sit like this, you can become very tight and have pain in sitting or standing with good posture. Try to be aware of your posture and sit and stand up tall to heal properly.  Follow up PT: Please let you doctor know as soon as possible if you develop any swelling in your face or neck in the future. Lymphedema (swelling) can occur months after completion of radiation. The sooner you can let the doctor know, the sooner they can refer you back to PT. It is much easier to treat the swelling early on.   Milagros Loll [place], PT 06/08/2023, 2:47 PM  PHYSICAL THERAPY DISCHARGE SUMMARY  Visits from Start of Care: 2  Current functional level related to goals / functional outcomes: All goals met   Remaining deficits: None   Education / Equipment: HEP, posture, lymphedema   Patient agrees to discharge. Patient goals were met. Patient is being discharged due to meeting the stated rehab goals.  Milagros Loll [place], Cassandra 06/08/23 2:47 PM

## 2023-06-09 ENCOUNTER — Telehealth: Payer: Self-pay | Admitting: Dietician

## 2023-06-09 ENCOUNTER — Other Ambulatory Visit: Payer: Self-pay

## 2023-06-09 ENCOUNTER — Ambulatory Visit
Admission: RE | Admit: 2023-06-09 | Discharge: 2023-06-09 | Disposition: A | Discharge status: Home / Self Care | Payer: 59 | Source: Ambulatory Visit | Attending: Radiation Oncology | Admitting: Radiation Oncology

## 2023-06-09 ENCOUNTER — Encounter: Payer: Self-pay | Admitting: Radiation Oncology

## 2023-06-09 ENCOUNTER — Inpatient Hospital Stay: Payer: 59 | Admitting: Dietician

## 2023-06-09 VITALS — BP 116/72 | HR 89 | Temp 97.4°F | Resp 18 | Ht 66.0 in | Wt 143.6 lb

## 2023-06-09 DIAGNOSIS — C021 Malignant neoplasm of border of tongue: Secondary | ICD-10-CM | POA: Diagnosis present

## 2023-06-09 DIAGNOSIS — Z923 Personal history of irradiation: Secondary | ICD-10-CM | POA: Insufficient documentation

## 2023-06-09 DIAGNOSIS — Z79899 Other long term (current) drug therapy: Secondary | ICD-10-CM | POA: Diagnosis not present

## 2023-06-09 NOTE — Progress Notes (Signed)
Oncology Nurse Navigator Documentation   I met with Meghan Ellis before and during her follow up appointment with Dr. Basilio Cairo today. She is recovering from radiation to her head and neck region. She will see Dr. Basilio Cairo again in mid January to receive results of CT scans completed before the appointment. She knows to call me if she has any needs or questions before then.   Hedda Slade RN, BSN, OCN Head & Neck Oncology Nurse Navigator [facility] at [facility] Phone # [phone]  Fax # [phone]

## 2023-06-09 NOTE — Telephone Encounter (Signed)
Nutrition Follow-up:  Patient with SCC of tongue s/p partial glossectomy. Pt completed 30/30 fractions 10/8.    Patient reports things are slowly improving. Ginette Pitman has resolved. Patient completed 13 day fluconazole on Wednesday. She started introducing solid foods this week. Recalls tolerating hamburger from [facility] yesterday. Nothing taste good.     Medications: reviewed   Labs: no new labs  Anthropometrics: Wt 143 lb 9.6 oz today   10/4 - 146.2 lb 9/30 - 145.4 lb  9/23 - 141.2 lb  9/16 - 146.2 lb 9/9 - 145.6 lb    Estimated Energy Needs   Kcals: 2000-2300 Protein: 80-100 Fluid: 2.5 L   NUTRITION DIAGNOSIS: Inadequate oral intake - continues but improving    INTERVENTION:  Encouraged high calorie high protein foods to support healing Continue nutrient dense protein shake   MONITORING, EVALUATION, GOAL: wt trends, intake   NEXT VISIT: Wednesday January 15 in clinic

## 2023-06-11 NOTE — Progress Notes (Signed)
Radiation Oncology         (336) [phone] ________________________________  Name: Meghan Ellis MRN: [id number]  Date: 06/09/2023  DOB: [birth year]  Follow-Up Visit Note  CC: Patient, No Pcp Per  Louie Casa, MD  Diagnosis and Prior Radiotherapy:       ICD-10-CM   1. 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)  C02.1      Cancer Staging  Malignant neoplasm of border of tongue [facility]) Staging form: Oral Cavity, AJCC 8th Edition - Pathologic stage from 03/28/2023: Stage III (pT3, pN0, cM0) - Signed by Lonie Peak, MD on 03/29/2023 Stage prefix: Initial diagnosis Presence of extranodal extension: Absent Perineural invasion (PNI): Present  First Treatment Date: 2023-04-11 - Last Treatment Date: 2023-05-23   Plan Name: HN_Tongue Site: Tongue Technique: IMRT Mode: Photon Dose Per Fraction: 2 Gy Prescribed Dose (Delivered / Prescribed): 60 Gy / 60 Gy Prescribed Fxs (Delivered / Prescribed): 30 / 30  CHIEF COMPLAINT:  Here for follow-up and surveillance of tongue cancer  Narrative:  The patient returns today for routine follow-up.  [name] presents for follow up today after completion of radiation for tongue cancer on 05-23-23.   Pain issues, if any: pt does have mouth sores Using a feeding tube?: never had feeding tube Weight changes, if any:  Wt Readings from Last 3 Encounters:  06/09/23 143 lb 9.6 oz (65.1 kg)  03/28/23 142 lb 6.4 oz (64.6 kg)  Pt using a protein supplement.   Swallowing issues, if any: throat is tight in the morning but improves during the day Smoking or chewing tobacco? Does not use Using fluoride toothpaste daily? Does not use Last ENT visit was on: none since diagnosis Other notable issues, if any: Pt taste continues to be altered. Pt reports tongue taste scalded. No major cancer or concerns.   Vitals:   06/09/23 1036  BP: 116/72  Pulse: 89  Resp: 18  Temp: (!) 97.4 F (36.3 C)  SpO2: 100%                      ALLERGIES:  is allergic to  hydrocodone.  Meds: Current Outpatient Medications  Medication Sig Dispense Refill   fluconazole (DIFLUCAN) 10 MG/ML suspension Swallow 20mL today; take 10mL daily for 13 more days. 150 mL 0   gabapentin (NEURONTIN) 300 MG/6ML solution Take 6 mLs (300 mg total) by mouth 3 (three) times daily. To help reduce pain. Take around the clock. 470 mL 0   lidocaine (XYLOCAINE) 2 % solution Patient: Mix 1part 2% viscous lidocaine, 1part H20. Swish & swallow 10mL of diluted mixture, before meals and at bedtime, up to QID 200 mL 3   Multiple Vitamins-Minerals (B COMPLEX-C-E-ZINC) tablet Take 1 tablet by mouth daily.     ondansetron (ZOFRAN) 8 MG tablet Take one tablet PO q 8 hrs PRN nausea. 30 tablet 3   XANAX 1 MG tablet Take 1 mg by mouth daily as needed for anxiety.     No current facility-administered medications for this encounter.    Physical Findings: The patient is in no acute distress. Patient is alert and oriented. Wt Readings from Last 3 Encounters:  06/09/23 143 lb 9.6 oz (65.1 kg)  03/28/23 142 lb 6.4 oz (64.6 kg)    height is 5\' 6"  (1.676 m) and weight is 143 lb 9.6 oz (65.1 kg). Her temperature is 97.4 F (36.3 C) (abnormal). Her blood pressure is 116/72 and her pulse is 89. Her respiration is 18  and oxygen saturation is 100%. .  General: Alert and oriented, in no acute distress HEENT: Head is normocephalic. Extraocular movements are intact. Oropharynx is notable for excellent healing in both mouth and upper throat Neck: Neck is notable for healing skin Skin: Skin in treatment fields shows satisfactory healing in neck Extremities: No cyanosis or edema. Psychiatric: Judgment and insight are intact. Affect is appropriate.   Lab Findings: No results found for: "WBC", "HGB", "HCT", "MCV", "PLT"  Lab Results  Component Value Date   TSH 2.190 04/11/2023    Radiographic Findings: No results found.  Impression/Plan:    1) Head and Neck Cancer Status: healing well from RT  - pain controlled, stopped gabapentin  2) Nutritional Status:  Wt Readings from Last 3 Encounters:  06/09/23 143 lb 9.6 oz (65.1 kg)  03/28/23 142 lb 6.4 oz (64.6 kg)    PEG tube: none  3) Risk Factors: The patient has been educated about risk factors including alcohol and tobacco abuse; they understand that avoidance of alcohol and tobacco is important to prevent recurrences as well as other cancers  4) Swallowing: good function overall  5) Dental: edentulous  6) Thyroid function: check yearly Lab Results  Component Value Date   TSH 2.190 04/11/2023    7) Other: she is active in her garden, eating well. Encouraged to continue this  8) Follow-up in January with CT restaging scans. The patient was encouraged to call with any issues or questions before then.  On date of service, in total, I spent 25 minutes on this encounter. Patient was seen in person. Note signed after encounter date; minutes pertain to date of service, only.  _____________________________________   Lonie Peak, MD

## 2023-06-14 ENCOUNTER — Ambulatory Visit: Payer: 59

## 2023-06-15 ENCOUNTER — Telehealth: Payer: Self-pay | Admitting: Radiation Oncology

## 2023-06-15 NOTE — Telephone Encounter (Signed)
10/31 @ 9:50 am Received urgent referral from Dr. Marcie Mowers office.  Referral an error -per Dr. Marcie Mowers receptionist.

## 2023-07-26 ENCOUNTER — Encounter: Payer: Self-pay | Admitting: *Deleted

## 2023-07-28 ENCOUNTER — Other Ambulatory Visit: Payer: Self-pay | Admitting: Radiation Oncology

## 2023-07-28 DIAGNOSIS — B029 Zoster without complications: Secondary | ICD-10-CM

## 2023-07-28 DIAGNOSIS — C021 Malignant neoplasm of border of tongue: Secondary | ICD-10-CM

## 2023-07-28 MED ORDER — VALACYCLOVIR HCL 1 G PO TABS
1000.0000 mg | ORAL_TABLET | Freq: Three times a day (TID) | ORAL | 0 refills | Status: AC
Start: 2023-07-28 — End: ?

## 2023-08-07 NOTE — Progress Notes (Signed)
Oncology Nurse Navigator Documentation   I called Ms. Racanelli regarding her [employer] with concerns about her tongue. I explained that Dr. Basilio Cairo had reviewed the pictures she sent and was not concerned about the discoloration of her tongue and that discoloration may be present after radiation normally. At Dr. Colletta [place] recommendation I instructed her to brush her tongue with a soft bristle tooth brush, use the baking soda rinse, and that it may take a while for her normal mouth flora to normalize and the discoloration to go away. She voiced appreciation for the phone call and knows to call me if she has any further questions or concerns.  Hedda Slade RN, BSN, OCN Head & Neck Oncology Nurse Navigator [facility] at [facility] Phone # [phone]  Fax # [phone]

## 2023-08-15 ENCOUNTER — Telehealth: Payer: Self-pay | Admitting: *Deleted

## 2023-08-15 NOTE — Telephone Encounter (Signed)
CALLED PATIENT TO INFORM OF CT FOR 08/21/23- ARRIVAL TIME- 5:15 PM @ [facility], PATIENT TO CHECK IN @ EMERGENCY DEPT, NO RESTRICTIONS TO SCAN, PATIENT TO RECEIVE RESULTS FROM DR. SQUIRE ON 08-30-23 @ 11 AM, LVM FOR A RETURN CALL

## 2023-08-18 ENCOUNTER — Telehealth: Payer: Self-pay | Admitting: Radiation Oncology

## 2023-08-18 ENCOUNTER — Telehealth: Payer: Self-pay | Admitting: *Deleted

## 2023-08-18 NOTE — Telephone Encounter (Signed)
 1/3 @ 10:10 am patient called after received mychart message to reschedule her CT scans and FU20 with Dr. Basilio Cairo.  She stated will not be back in the country until 1/9. Left voicemail with Darryl Nestle, so they are aware.

## 2023-08-18 NOTE — Telephone Encounter (Signed)
 CALLED PATIENT TO INFORM THAT CT HAS BEEN MOVED TO 08-28-23- ARRIVAL TIME- 3:45 PM @ [facility], NO RESTRICTIONS TO SCAN, PATIENT TO RECEIVE RESULTS FROM DR. SQUIRE ON 08-30-23 @ 11 AM, SPOKE WITH PATIENT AND SHE IS AWARE OF THESE APPTS. AND THE INSTRUCTIONS

## 2023-08-21 ENCOUNTER — Ambulatory Visit (HOSPITAL_COMMUNITY): Payer: 59

## 2023-08-28 ENCOUNTER — Ambulatory Visit (HOSPITAL_COMMUNITY)
Admission: RE | Admit: 2023-08-28 | Discharge: 2023-08-28 | Disposition: A | Discharge status: Home / Self Care | Payer: 59 | Source: Ambulatory Visit | Attending: Radiation Oncology | Admitting: Radiation Oncology

## 2023-08-28 DIAGNOSIS — C021 Malignant neoplasm of border of tongue: Secondary | ICD-10-CM | POA: Diagnosis present

## 2023-08-28 MED ORDER — IOHEXOL 300 MG/ML  SOLN
75.0000 mL | Freq: Once | INTRAMUSCULAR | Status: AC | PRN
  Administered 2023-08-28: 75 mL via INTRAVENOUS

## 2023-08-30 ENCOUNTER — Inpatient Hospital Stay: Payer: 59 | Admitting: Dietician

## 2023-08-30 ENCOUNTER — Ambulatory Visit
Admission: RE | Admit: 2023-08-30 | Discharge: 2023-08-30 | Disposition: A | Discharge status: Home / Self Care | Payer: 59 | Source: Ambulatory Visit | Attending: Radiation Oncology | Admitting: Radiation Oncology

## 2023-08-30 ENCOUNTER — Encounter: Payer: Self-pay | Admitting: Radiation Oncology

## 2023-08-30 VITALS — BP 159/93 | HR 102 | Temp 97.8°F | Resp 18 | Ht 66.0 in | Wt 144.6 lb

## 2023-08-30 DIAGNOSIS — R5383 Other fatigue: Secondary | ICD-10-CM

## 2023-08-30 DIAGNOSIS — C021 Malignant neoplasm of border of tongue: Secondary | ICD-10-CM

## 2023-08-30 NOTE — Progress Notes (Signed)
 Nutrition Follow-up:  Patient with SCC of tongue s/p partial glossectomy. Pt completed 30/30 fractions 10/8.    Met with patient in office. She reports doing well overall. Her taste is off with some foods, states they taste burnt. Patient is tolerating regular textures without difficulty. She endorses ongoing fatigue. Patient is drinking high calorie protein shake (ice cream, peanut butter, yogurt, berries).    Medications: reviewed   Labs: no new labs  Anthropometrics: Wt 144 lb 9.6 oz today   10/25 - 143 lb 9.6 oz  10/4 - 146.2 lb 9/30 - 145.4 lb  9/23 - 141.2 lb  9/16 - 146.2 lb 9/9 - 145.6 lb   NUTRITION DIAGNOSIS: Inadequate oral intake improved    INTERVENTION:  Continue high calorie high protein foods for ongoing post treatment healing     MONITORING, EVALUATION, GOAL: wt trends, intake   NEXT VISIT: No follow-up scheduled. Patient encouraged to call with nutrition questions/concerns

## 2023-08-30 NOTE — Progress Notes (Signed)
 Radiation Oncology         (336) [phone] ________________________________  Name: Meghan Ellis MRN: [id number]  Date: 08/30/2023  DOB: Feb 05, 1966  Follow-Up Visit Note  CC: [name], NP  [name], MD  Diagnosis and Prior Radiotherapy:       ICD-10-CM   1. 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 Other fatigue  R53.83 TSH     Cancer Staging  Malignant neoplasm of border of tongue [facility]) Staging form: Oral Cavity, AJCC 8th Edition - Pathologic stage from 03/28/2023: Stage III (pT3, pN0, cM0) - Signed by Colie Dawes, MD on 03/29/2023 Stage prefix: Initial diagnosis Presence of extranodal extension: Absent Perineural invasion (PNI): Present  First Treatment Date: 2023-04-11 - Last Treatment Date: 2023-05-23   Plan Name: HN_Tongue Site: Tongue Technique: IMRT Mode: Photon Dose Per Fraction: 2 Gy Prescribed Dose (Delivered / Prescribed): 60 Gy / 60 Gy Prescribed Fxs (Delivered / Prescribed): 30 / 30  CHIEF COMPLAINT:  Here for follow-up and surveillance of tongue cancer  Narrative:  The patient returns today for routine follow-up.  Meghan Ellis presents for follow up today after completion of radiation for tongue cancer on 05-23-23.  She is doing relatively well.  She notes some mild lymphedema and also is still coping with a hairy tongue.  She reports that her head and neck shingles responded well to antiviral medication.  She wonders if she can start wearing make-up again.      Wt Readings from Last 3 Encounters:  08/30/23 144 lb 9.6 oz (65.6 kg)  06/09/23 143 lb 9.6 oz (65.1 kg)  03/28/23 142 lb 6.4 oz (64.6 kg)     ALLERGIES:  is allergic to hydrocodone.  Meds: Current Outpatient Medications  Medication Sig Dispense Refill   fluconazole  (DIFLUCAN ) 10 MG/ML suspension Swallow 20mL today; take 10mL daily for 13 more days. 150 mL 0   gabapentin  (NEURONTIN ) 300 MG/6ML solution Take 6 mLs (300 mg total) by mouth 3 (three) times daily. To help  reduce pain. Take around the clock. 470 mL 0   lidocaine  (XYLOCAINE ) 2 % solution Patient: Mix 1part 2% viscous lidocaine , 1part H20. Swish & swallow 10mL of diluted mixture, 30min before meals and at bedtime, up to QID 200 mL 3   Multiple Vitamins-Minerals (B COMPLEX-C-E-ZINC) tablet Take 1 tablet by mouth daily.     ondansetron  (ZOFRAN ) 8 MG tablet Take one tablet PO q 8 hrs PRN nausea. 30 tablet 3   valACYclovir  (VALTREX ) 1000 MG tablet Take 1 tablet (1,000 mg total) by mouth every 8 (eight) hours. 21 tablet 0   XANAX 1 MG tablet Take 1 mg by mouth daily as needed for anxiety.     No current facility-administered medications for this encounter.    Physical Findings: The patient is in no acute distress. Patient is alert and oriented. Wt Readings from Last 3 Encounters:  08/30/23 144 lb 9.6 oz (65.6 kg)  06/09/23 143 lb 9.6 oz (65.1 kg)  03/28/23 142 lb 6.4 oz (64.6 kg)    height is 5\' 6"  (1.676 m) and weight is 144 lb 9.6 oz (65.6 kg). Her temperature is 97.8 F (36.6 C). Her blood pressure is 159/93 (abnormal) and her pulse is 102 (abnormal). Her respiration is 18 and oxygen saturation is 100%. .  General: Alert and oriented, in no acute distress HEENT: Head is normocephalic. Extraocular movements are intact. Oropharynx is notable for no sign of tumor.  She does have an  oral tongue with a hairy appearance and postsurgical changes.    Neck: Skin has healed well over her neck.  Mild lymphedema in neck.  She still has some pigment changes related to a shingles outbreak over the left side of her neck.  No palpable masses in the neck. Skin: Skin in treatment fields shows satisfactory healing in neck, see above Extremities: No cyanosis or edema. Psychiatric: Judgment and insight are intact. Affect is appropriate. Heart is regular rate and rhythm Chest is clear to auscultation bilaterally Abdomen is soft nontender nondistended  Lab Findings: No results found for: "WBC", "HGB", "HCT", "MCV",  "PLT"  Lab Results  Component Value Date   TSH 2.190 04/11/2023    Radiographic Findings: CT Soft Tissue Neck W Contrast Result Date: 08/29/2023 CLINICAL DATA:  Head neck cancer, monitoring. EXAM: CT NECK WITH CONTRAST TECHNIQUE: Multidetector CT imaging of the neck was performed using the standard protocol following the bolus administration of intravenous contrast. RADIATION DOSE REDUCTION: This exam was performed according to the departmental dose-optimization program which includes automated exposure control, adjustment of the mA and/or kV according to patient size and/or use of iterative reconstruction technique. CONTRAST:  75mL OMNIPAQUE  IOHEXOL  300 MG/ML  SOLN COMPARISON:  None Available. FINDINGS: Pharynx and larynx: Defect in the left anterior tongue, first baseline after resection with no clear enhancing residual seen. Post treatment submucosal and retropharyngeal low-density Salivary glands: Symmetric and unremarkable for post treatment neck. Thyroid : Solitary visible right lobe with 5 mm nodule. Lymph nodes: None enlarged or abnormal density. Vascular: Mild atheromatous calcification at the right carotid bifurcation and aorta. Limited intracranial: Negative. Visualized orbits: Not covered Mastoids and visualized paranasal sinuses: Clear where covered Skeleton: Edentulous patient with no bony erosion. Generalized cervical spine degeneration with endplate and facet ridging. Upper chest: Clear apical lungs. IMPRESSION: First baseline after anterior tongue resection and radiation. No adenopathy or focal mass. Electronically Signed   By: Ronnette Coke M.D.   On: 08/29/2023 08:28   CT Chest W Contrast Result Date: 08/29/2023 CLINICAL DATA:  Head/neck cancer, for restaging EXAM: CT CHEST WITH CONTRAST TECHNIQUE: Multidetector CT imaging of the chest was performed during intravenous contrast administration. RADIATION DOSE REDUCTION: This exam was performed according to the departmental  dose-optimization program which includes automated exposure control, adjustment of the mA and/or kV according to patient size and/or use of iterative reconstruction technique. CONTRAST:  75mL OMNIPAQUE  IOHEXOL  300 MG/ML  SOLN COMPARISON:  Outside hospital [facility]) PET-CT dated 02/21/2023 FINDINGS: Cardiovascular: Heart is normal in size.  No pericardial effusion. No evidence of thoracic aortic aneurysm. Mild atherosclerotic calcifications of the aortic arch. Mediastinum/Nodes: No suspicious mediastinal lymphadenopathy. Visualized right thyroid  is unremarkable. Status post left thyroidectomy. Lungs/Pleura: Mild biapical pleural-parenchymal scarring. Mild centrilobular emphysematous changes, upper lung predominant. No suspicious pulmonary nodules. Minimal dependent atelectasis in the bilateral lower lobes. No focal consolidation. No pleural effusion pneumothorax. Upper Abdomen: 14 mm posterior right hepatic lobe cyst (series 3/image 52), benign. Visualized upper abdomen is otherwise grossly unremarkable. Musculoskeletal: Mild degenerative changes of the mid thoracic spine. IMPRESSION: No evidence of metastatic disease in the chest. Aortic Atherosclerosis (ICD10-I70.0) and Emphysema (ICD10-J43.9). Electronically Signed   By: Zadie Herter M.D.   On: 08/29/2023 00:37    Impression/Plan:    1) Head and Neck Cancer Status: healing well from RT with no evidence disease of disease on her physical exam or imaging.  We reviewed her imaging in detail.  She had good questions about incidental findings which I addressed in  detail.  I told her it is reasonable for her to continue carotid ultrasounds to her neck given her history of head and neck surgery and radiation.  She does not need to resume carotid ultrasounds until 5 years from now but she likes to be proactive about this and I think that is fine as long as she can afford it  2) Nutritional Status: Doing well, no active issues Wt Readings from Last 3  Encounters:  08/30/23 144 lb 9.6 oz (65.6 kg)  06/09/23 143 lb 9.6 oz (65.1 kg)  03/28/23 142 lb 6.4 oz (64.6 kg)    PEG tube: none  3) Risk Factors: The patient has been educated about risk factors including alcohol and tobacco abuse; they understand that avoidance of alcohol and tobacco is important to prevent recurrences as well as other cancers  4) Swallowing: good function overall  5) Dental: edentulous.  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  I recommended an over-the-counter alcohol free mouthwash with hydrogen peroxide and she will continue to scrape her tongue gently when cleansing her mouth  6) Thyroid  function: check yearly, I have ordered for this to be done at her next follow-up in 6 months Lab Results  Component Value Date   TSH 2.190 04/11/2023    7) Other: she is active in her garden, eating well. Encouraged to continue this.  We will refer her back to physical therapy as she is having some mild lymphedema that is bothersome.  Advised to continue moisturizing her skin, it is okay to start using make-up  8) Follow-up in 6 months with Julio Ohm PA for physical exam.  I recommend that she then be scheduled in another 6 months for CT of neck and chest with contrast and physical exam. The patient was encouraged to call with any issues or questions before then.  On date of service, in total, I spent 40 minutes on this encounter. Patient was seen in person.   _____________________________________   Colie Dawes, MD

## 2023-08-30 NOTE — Progress Notes (Signed)
 Meghan Ellis returns today to receive imaging results.  She completed radiation therapy to her Tongue 05/23/2023.  Pain: Patient denies any pain.  Questions/Concerns: Wants to know when she can start wearing makeup again.  Patient wants to see a physical therapist for lymphedema.

## 2023-08-31 ENCOUNTER — Other Ambulatory Visit: Payer: Self-pay

## 2023-08-31 DIAGNOSIS — C021 Malignant neoplasm of border of tongue: Secondary | ICD-10-CM

## 2023-11-24 NOTE — Therapy (Signed)
 [place] [facility] [address] [place], [place], [address] Phone: [phone]   Fax:  [phone]  Patient Details  Name: Meghan Ellis MRN: [id number] Date of Birth: October 07, 1965 Referring Provider:  Lonie Peak, MD  Encounter Date: 11/24/2023  SPEECH THERAPY DISCHARGE SUMMARY  Visits from Start of Care: 2  Current functional level related to goals / functional outcomes: Pt cancelled her appointment on 06/14/23 and did not reschedule this appointment. Assumed she thinks she no longer desires or requires ST. Pt will formally be d/c'd today. Goals from her last scheduled appointment on 05/18/23 are below: GOALS: Goals reviewed with patient? No SHORT TERM GOALS: Target: 3rd total session   1. Pt will compelte HEP with modified independence in 2 sessions Baseline: Goal status: INITIAL   2.  pt will tell SLP why pt is completing HEP with modified independence Baseline:  Goal status: INITIAL   3.  pt will describe 3 overt s/s aspiration PNA with modified independence Baseline:  Goal status: INITIAL   4.  pt will tell SLP how a food journal could hasten return to a more normalized diet Baseline:  Goal status: INITIAL     LONG TERM GOALS: Target: 7th total session   1.  pt will complete HEP with independence over two visits Baseline:  Goal status: INITIAL   2.  pt will describe how to modify HEP over time, and the timeline associated with reduction in HEP frequency with modified independence over two sessions Baseline:  Goal status: INITIAL  Follow up note from Dr. Basilio Cairo from January 2025 states: PEG tube: none   3) Risk Factors: The patient has been educated about risk factors including alcohol and tobacco abuse; they understand that avoidance of alcohol and tobacco is important to prevent recurrences as well as other cancers   4) Swallowing: good function overall   5) Dental: edentulous.  She does have hairy  tongue syndrome which is common after oral cavity radiation.  I recommended an over-the-counter alcohol free mouthwash with hydrogen peroxide and she will continue to scrape her tongue gently when cleansing her mouth   6) Thyroid function: check yearly, I have ordered for this to be done at her next follow-up in 6 months   Remaining deficits: Unknown as this is unexpected d/c.   Education / Equipment: See therapy notes   Patient agrees to discharge. Patient goals were not met. Patient is being discharged due to not returning since the last visit.Marland Kitchen    [facility], CCC-SLP 11/24/2023, 8:37 AM  [place] [facility] [address] [place], [place], [address] Phone: [phone]   Fax:  [phone]

## 2024-02-27 ENCOUNTER — Ambulatory Visit: Payer: 59

## 2024-02-27 ENCOUNTER — Ambulatory Visit: Payer: Self-pay | Admitting: Radiology

## 2024-02-27 ENCOUNTER — Ambulatory Visit: Admitting: Radiology
